# Patient Record
Sex: Male | Born: 1955
Health system: Southern US, Community
[De-identification: ages and names within clinical notes are randomized; demographics above are authoritative.]

## PROBLEM LIST (undated history)

## (undated) DIAGNOSIS — I252 Old myocardial infarction: Secondary | ICD-10-CM

## (undated) DIAGNOSIS — F32A Depression, unspecified: Secondary | ICD-10-CM

## (undated) DIAGNOSIS — I10 Essential (primary) hypertension: Secondary | ICD-10-CM

## (undated) DIAGNOSIS — C61 Malignant neoplasm of prostate: Secondary | ICD-10-CM

## (undated) DIAGNOSIS — R3915 Urgency of urination: Secondary | ICD-10-CM

## (undated) DIAGNOSIS — I2510 Atherosclerotic heart disease of native coronary artery without angina pectoris: Secondary | ICD-10-CM

## (undated) DIAGNOSIS — C3490 Malignant neoplasm of unspecified part of unspecified bronchus or lung: Secondary | ICD-10-CM

## (undated) DIAGNOSIS — R058 Other specified cough: Secondary | ICD-10-CM

## (undated) DIAGNOSIS — Z955 Presence of coronary angioplasty implant and graft: Secondary | ICD-10-CM

## (undated) DIAGNOSIS — E785 Hyperlipidemia, unspecified: Secondary | ICD-10-CM

## (undated) DIAGNOSIS — M1990 Unspecified osteoarthritis, unspecified site: Secondary | ICD-10-CM

## (undated) DIAGNOSIS — R350 Frequency of micturition: Secondary | ICD-10-CM

## (undated) DIAGNOSIS — M329 Systemic lupus erythematosus, unspecified: Secondary | ICD-10-CM

## (undated) DIAGNOSIS — J410 Simple chronic bronchitis: Secondary | ICD-10-CM

## (undated) DIAGNOSIS — F329 Major depressive disorder, single episode, unspecified: Secondary | ICD-10-CM

## (undated) DIAGNOSIS — Z974 Presence of external hearing-aid: Secondary | ICD-10-CM

## (undated) DIAGNOSIS — R05 Cough: Secondary | ICD-10-CM

## (undated) HISTORY — DX: Malignant neoplasm of unspecified part of unspecified bronchus or lung: C34.90

## (undated) HISTORY — PX: DOBUTAMINE STRESS ECHO: SHX5426

## (undated) HISTORY — PX: CORONARY ARTERY BYPASS GRAFT: SHX141

## (undated) HISTORY — PX: CARDIOVASCULAR STRESS TEST: SHX262

## (undated) HISTORY — PX: CORONARY ANGIOPLASTY WITH STENT PLACEMENT: SHX49

## (undated) HISTORY — PX: LUNG CANCER SURGERY: SHX702

## (undated) HISTORY — PX: TONSILLECTOMY: SUR1361

---

## 2004-04-21 ENCOUNTER — Other Ambulatory Visit: Payer: Self-pay

## 2004-04-21 ENCOUNTER — Inpatient Hospital Stay: Payer: Self-pay | Admitting: Internal Medicine

## 2004-04-24 ENCOUNTER — Other Ambulatory Visit: Payer: Self-pay

## 2005-05-04 ENCOUNTER — Ambulatory Visit: Payer: Self-pay | Admitting: Cardiology

## 2005-05-04 ENCOUNTER — Inpatient Hospital Stay (HOSPITAL_COMMUNITY): Admission: EM | Admit: 2005-05-04 | Discharge: 2005-05-07 | Payer: Self-pay | Admitting: Emergency Medicine

## 2009-12-05 DIAGNOSIS — I10 Essential (primary) hypertension: Secondary | ICD-10-CM | POA: Insufficient documentation

## 2009-12-05 DIAGNOSIS — F32A Depression, unspecified: Secondary | ICD-10-CM | POA: Insufficient documentation

## 2011-01-08 DIAGNOSIS — K648 Other hemorrhoids: Secondary | ICD-10-CM | POA: Insufficient documentation

## 2013-03-19 HISTORY — PX: WRIST GANGLION EXCISION: SUR520

## 2014-10-18 ENCOUNTER — Other Ambulatory Visit (HOSPITAL_COMMUNITY): Payer: Self-pay | Admitting: Family Medicine

## 2014-10-18 DIAGNOSIS — R319 Hematuria, unspecified: Secondary | ICD-10-CM

## 2014-10-20 ENCOUNTER — Other Ambulatory Visit (HOSPITAL_COMMUNITY): Payer: Self-pay

## 2016-04-11 ENCOUNTER — Other Ambulatory Visit (HOSPITAL_COMMUNITY)
Admission: AD | Admit: 2016-04-11 | Discharge: 2016-04-11 | Disposition: A | Discharge status: Home / Self Care | Payer: Commercial Managed Care - PPO | Source: Skilled Nursing Facility | Attending: Urology | Admitting: Urology

## 2016-04-11 ENCOUNTER — Ambulatory Visit (INDEPENDENT_AMBULATORY_CARE_PROVIDER_SITE_OTHER): Payer: Commercial Managed Care - PPO | Admitting: Urology

## 2016-04-11 ENCOUNTER — Other Ambulatory Visit: Payer: Self-pay | Admitting: Urology

## 2016-04-11 DIAGNOSIS — R3129 Other microscopic hematuria: Secondary | ICD-10-CM | POA: Diagnosis not present

## 2016-04-11 DIAGNOSIS — R972 Elevated prostate specific antigen [PSA]: Secondary | ICD-10-CM | POA: Diagnosis not present

## 2016-04-11 LAB — URINALYSIS, ROUTINE W REFLEX MICROSCOPIC
Bacteria, UA: NONE SEEN
Bilirubin Urine: NEGATIVE
GLUCOSE, UA: NEGATIVE mg/dL
Ketones, ur: NEGATIVE mg/dL
Leukocytes, UA: NEGATIVE
Nitrite: NEGATIVE
PROTEIN: NEGATIVE mg/dL
SPECIFIC GRAVITY, URINE: 1.025 (ref 1.005–1.030)
pH: 5 (ref 5.0–8.0)

## 2016-05-02 ENCOUNTER — Ambulatory Visit (HOSPITAL_COMMUNITY)
Admission: RE | Admit: 2016-05-02 | Discharge: 2016-05-02 | Disposition: A | Discharge status: Home / Self Care | Payer: Commercial Managed Care - PPO | Source: Ambulatory Visit | Attending: Urology | Admitting: Urology

## 2016-05-02 DIAGNOSIS — R972 Elevated prostate specific antigen [PSA]: Secondary | ICD-10-CM | POA: Diagnosis not present

## 2016-05-02 DIAGNOSIS — Z8042 Family history of malignant neoplasm of prostate: Secondary | ICD-10-CM | POA: Insufficient documentation

## 2016-05-02 DIAGNOSIS — C61 Malignant neoplasm of prostate: Secondary | ICD-10-CM | POA: Diagnosis not present

## 2016-05-02 HISTORY — PX: PROSTATE BIOPSY: SHX241

## 2016-05-02 MED ORDER — LIDOCAINE HCL (PF) 2 % IJ SOLN
10.0000 mL | Freq: Once | INTRAMUSCULAR | Status: AC
  Administered 2016-05-02: 10 mL

## 2016-05-02 MED ORDER — GENTAMICIN SULFATE 40 MG/ML IJ SOLN
INTRAMUSCULAR | Status: AC
  Administered 2016-05-02: 80 mg via INTRAMUSCULAR
  Filled 2016-05-02: qty 2

## 2016-05-02 MED ORDER — GENTAMICIN SULFATE 40 MG/ML IJ SOLN
80.0000 mg | Freq: Once | INTRAMUSCULAR | Status: AC
  Administered 2016-05-02: 80 mg via INTRAMUSCULAR

## 2016-05-02 MED ORDER — LIDOCAINE HCL (PF) 2 % IJ SOLN
INTRAMUSCULAR | Status: AC
  Administered 2016-05-02: 10 mL
  Filled 2016-05-02: qty 10

## 2016-05-02 NOTE — Discharge Instructions (Signed)
Transrectal Ultrasound-Guided Biopsy °A transrectal ultrasound-guided biopsy is a procedure to take samples of tissue from your prostate. Ultrasound images are used to guide the procedure. It is usually done to check the prostate gland for cancer. °What happens before the procedure? °· Do not eat or drink after midnight on the night before your procedure. °· Take medicines as your doctor tells you. °· Your doctor may have you stop taking some medicines 5-7 days before the procedure. °· You will be given an enema before your procedure. During an enema, a liquid is put into your butt (rectum) to clear out waste. °· You may have lab tests the day of your procedure. °· Make plans to have someone drive you home. °What happens during the procedure? °· You will be given medicine to help you relax before the procedure. An IV tube will be put into one of your veins. It will be used to give fluids and medicine. °· You will be given medicine to reduce the risk of infection (antibiotic). °· You will be placed on your side. °· A probe with gel will be put in your butt. This is used to take pictures of your prostate and the area around it. °· A medicine to numb the area is put into your prostate. °· A biopsy needle is then inserted and guided to your prostate. °· Samples of prostate tissue are taken. The needle is removed. °· The samples are sent to a lab to be checked. Results are usually back in 2-3 days. °What happens after the procedure? °· You will be taken to a room where you will be watched until you are doing okay. °· You may have some pain in the area around your butt. You will be given medicines for this. °· You may be able to go home the same day. Sometimes, an overnight stay in the hospital is needed. °This information is not intended to replace advice given to you by your health care provider. Make sure you discuss any questions you have with your health care provider. °Document Released: 02/21/2009 Document Revised:  08/11/2015 Document Reviewed: 10/22/2012 °Elsevier Interactive Patient Education © 2017 Elsevier Inc. ° °

## 2016-05-07 DIAGNOSIS — M1990 Unspecified osteoarthritis, unspecified site: Secondary | ICD-10-CM | POA: Diagnosis not present

## 2016-05-07 DIAGNOSIS — R221 Localized swelling, mass and lump, neck: Secondary | ICD-10-CM | POA: Diagnosis not present

## 2016-05-07 DIAGNOSIS — R0982 Postnasal drip: Secondary | ICD-10-CM | POA: Diagnosis not present

## 2016-05-16 ENCOUNTER — Ambulatory Visit (INDEPENDENT_AMBULATORY_CARE_PROVIDER_SITE_OTHER): Payer: Commercial Managed Care - PPO | Admitting: Urology

## 2016-05-16 DIAGNOSIS — C61 Malignant neoplasm of prostate: Secondary | ICD-10-CM

## 2016-05-17 ENCOUNTER — Encounter: Payer: Self-pay | Admitting: Radiation Oncology

## 2016-05-17 DIAGNOSIS — C61 Malignant neoplasm of prostate: Secondary | ICD-10-CM | POA: Diagnosis not present

## 2016-05-18 ENCOUNTER — Other Ambulatory Visit: Payer: Self-pay | Admitting: Urology

## 2016-05-18 DIAGNOSIS — C61 Malignant neoplasm of prostate: Secondary | ICD-10-CM

## 2016-05-23 ENCOUNTER — Encounter (HOSPITAL_COMMUNITY)
Admission: RE | Admit: 2016-05-23 | Discharge: 2016-05-23 | Disposition: A | Discharge status: Home / Self Care | Payer: Commercial Managed Care - PPO | Source: Ambulatory Visit | Attending: Urology | Admitting: Urology

## 2016-05-23 ENCOUNTER — Ambulatory Visit (HOSPITAL_COMMUNITY)
Admission: RE | Admit: 2016-05-23 | Discharge: 2016-05-23 | Disposition: A | Discharge status: Home / Self Care | Payer: Commercial Managed Care - PPO | Source: Ambulatory Visit | Attending: Urology | Admitting: Urology

## 2016-05-23 DIAGNOSIS — I700 Atherosclerosis of aorta: Secondary | ICD-10-CM | POA: Diagnosis not present

## 2016-05-23 DIAGNOSIS — C61 Malignant neoplasm of prostate: Secondary | ICD-10-CM

## 2016-05-23 DIAGNOSIS — K4020 Bilateral inguinal hernia, without obstruction or gangrene, not specified as recurrent: Secondary | ICD-10-CM | POA: Insufficient documentation

## 2016-05-23 DIAGNOSIS — N201 Calculus of ureter: Secondary | ICD-10-CM | POA: Diagnosis not present

## 2016-05-23 DIAGNOSIS — N400 Enlarged prostate without lower urinary tract symptoms: Secondary | ICD-10-CM | POA: Diagnosis not present

## 2016-05-23 MED ORDER — TECHNETIUM TC 99M MEDRONATE IV KIT
25.0000 | PACK | Freq: Once | INTRAVENOUS | Status: AC | PRN
  Administered 2016-05-23: 20.1 via INTRAVENOUS

## 2016-05-23 MED ORDER — IOPAMIDOL (ISOVUE-300) INJECTION 61%
100.0000 mL | Freq: Once | INTRAVENOUS | Status: AC | PRN
  Administered 2016-05-23: 100 mL via INTRAVENOUS

## 2016-05-28 ENCOUNTER — Encounter: Payer: Self-pay | Admitting: Radiation Oncology

## 2016-05-28 NOTE — Progress Notes (Signed)
GU Location of Tumor / Histology: prostatic adenocarcinoma  If Prostate Cancer, Gleason Score is (4 + 3) and PSA is (5.0). Prostate volume: 54 gram.  Luis Griffin was referred to Dr. Alyson Ingles in February 2018 for evaluation of an elevated PSA.   Biopsies of prostate (if applicable) revealed:    Past/Anticipated interventions by urology, if any: biopsy, referral to radiation oncology  Past/Anticipated interventions by medical oncology, if any: no  Weight changes, if any: no  Wt Readings from Last 3 Encounters:  05/31/16 211 lb 3.2 oz (95.8 kg)    Bowel/Bladder complaints, if any: ED, sensation of not emptying his bladder completely, frequency, intermittency, urgency, weak stream, hesitancy, nocturia x 1   Nausea/Vomiting, if any: Had some nausea last night while trying to have a bowel movement.  Pain issues, if any:  No  SAFETY ISSUES:  Prior radiation? No  Pacemaker/ICD? No  Possible current pregnancy? no  Is the patient on methotrexate? No  Current Complaints / other details:  61 year old male. NKDA. Single. Has one son and one daughter. CT of abd/pelvis negative. Bone scan negative. Father with history of prostate ca. BP (!) 158/99   Pulse 81   Temp 98.2 F (36.8 C) (Oral)   Resp 18   Ht '5\' 11"'$  (1.803 m)   Wt 211 lb 3.2 oz (95.8 kg)   SpO2 100%   BMI 29.46 kg/m

## 2016-05-30 DIAGNOSIS — C61 Malignant neoplasm of prostate: Secondary | ICD-10-CM | POA: Insufficient documentation

## 2016-05-30 NOTE — Progress Notes (Signed)
Radiation Oncology         [id number]) [phone] ________________________________  Initial outpatient Consultation  Name: Luis Griffin MRN: [id number]  Date: 05/31/2016  DOB: 26-May-1955  [id number] FAMILY PRACTINE  McKenzie, Candee Furbish, MD   REFERRING PHYSICIAN: Cleon Gustin, MD  DIAGNOSIS: 61 y.o. gentleman with Stage T1c adenocarcinoma of the prostate with Gleason Score of 4+3=7, and PSA of 5.0.    ICD-9-CM ICD-10-CM   1. Prostate cancer ([place]) 185 C61     HISTORY OF PRESENT ILLNESS: Luis Griffin is a 61 y.o. male with a diagnosis of prostate cancer. He was noted to have an elevated PSA of 5.0 by his primary care physician, Dr. Judd Lien.  Accordingly, he was referred for evaluation in urology by Dr. Alyson Ingles on 04/11/16,  digital rectal examination was performed at that time revealing no nodules or induration.  The patient proceeded to transrectal ultrasound with 12 biopsies of the prostate on 05/02/16.  The prostate volume measured 54 cc.  Out of 12 core biopsies,2 were positive.  The maximum Gleason score was 4+3=7, and this was seen in the right base.  He underwent a Bone scan and CT C/A/P on 05/23/16 both of which were negative for metastatic disease.  He has a family history of prostate cancer in his father, diagnosed at age 49.  The patient reviewed the biopsy results with his urologist and he has kindly been referred today for discussion of potential radiation treatment options.     PREVIOUS RADIATION THERAPY: No  PAST MEDICAL HISTORY:  Past Medical History:  Diagnosis Date  . Depression   . Heart attack   . Heart disease   . Hypercholesterolemia   . Hypertension   . Prostate cancer ([place])   . Systemic lupus erythematosus ([place])       PAST SURGICAL HISTORY: Past Surgical History:  Procedure Laterality Date  . aspirate ganglion cyst    . CORONARY ARTERY BYPASS GRAFT    . PROSTATE BIOPSY      FAMILY HISTORY:  Family History  Problem Relation Age of  Onset  . Cancer Father     prostate ca  . Kidney cancer Father     SOCIAL HISTORY:  He presents to the office today with his fiance, Lollie Marrow. They live together in [place], [place].  He is a long distance Administrator. Social History   Social History  . Marital status: Unknown    Spouse name: N/A  . Number of children: N/A  . Years of education: N/A   Occupational History  . Not on file.   Social History Main Topics  . Smoking status: Former Smoker    Packs/day: 1.00    Types: Cigarettes    Quit date: 03/20/1975  . Smokeless tobacco: Never Used  . Alcohol use No  . Drug use: No  . Sexual activity: Not on file   Other Topics Concern  . Not on file   Social History Narrative  . No narrative on file    ALLERGIES: Varenicline  MEDICATIONS:  Current Outpatient Prescriptions  Medication Sig Dispense Refill  . aspirin 325 MG tablet Take 325 mg by mouth daily.    Marland Kitchen atenolol (TENORMIN) 25 MG tablet Take by mouth daily.    Marland Kitchen atorvastatin (LIPITOR) 10 MG tablet Take 10 mg by mouth daily.    . clopidogrel (PLAVIX) 75 MG tablet Take 75 mg by mouth daily.    Marland Kitchen ezetimibe (ZETIA) 10 MG tablet Take 10 mg by mouth daily.    Marland Kitchen  lisinopril (PRINIVIL,ZESTRIL) 10 MG tablet Take 10 mg by mouth daily.     No current facility-administered medications for this encounter.     REVIEW OF SYSTEMS:  On review of systems, the patient reports that he is doing well overall. He denies any chest pain, shortness of breath, cough, fevers, chills, night sweats, unintended weight changes. Heh as a history of heart disease and is s/p CABG 8 years ago. He takes ASA and Plavix daily since the time of his CABG. He does not use NTG. He denies any bowel disturbances, and denies abdominal pain, or vomiting. He reports longstanding constipation with bowel movements q 2 days.  He did have some significant bloating and nausea last night while trying to have a bowel movement. He reports that his stools are not generally  particularly hard but he occasionally gets an urge to defecate and cannot. This has been ongoing for years and unchanged recently. He denies any new musculoskeletal or joint aches or pains. His IPSS was 3, indicating mild urinary symptoms including frequency, intermittency, urgency and nocturia x 1. He reports mild ED. A complete review of systems is obtained and is otherwise negative.    PHYSICAL EXAM:  Wt Readings from Last 3 Encounters:  05/31/16 211 lb 3.2 oz (95.8 kg)   Temp Readings from Last 3 Encounters:  05/31/16 98.2 F (36.8 C) (Oral)  05/02/16 97.9 F (36.6 C) (Oral)   BP Readings from Last 3 Encounters:  05/31/16 (!) 158/99  05/02/16 (!) 148/88   Pulse Readings from Last 3 Encounters:  05/31/16 81  05/02/16 76   Pain Assessment Pain Score: 0-No pain/10  In general this is a well appearing caucasian male in no acute distress. He is alert and oriented x4 and appropriate throughout the examination. HEENT reveals that the patient is normocephalic, atraumatic. EOMs are intact. PERRLA. Skin is intact without any evidence of gross lesions. Cardiovascular exam reveals a regular rate and rhythm, no clicks rubs or murmurs are auscultated. Chest is clear to auscultation bilaterally. Lymphatic assessment is performed and does not reveal any adenopathy in the cervical, supraclavicular, axillary, or inguinal chains. Abdomen has active bowel sounds in all quadrants and is intact. The abdomen is soft, non tender, non distended. Lower extremities are negative for pretibial pitting edema, deep calf tenderness, cyanosis or clubbing.   KPS = 100  100 - Normal; no complaints; no evidence of disease. 90   - Able to carry on normal activity; minor signs or symptoms of disease. 80   - Normal activity with effort; some signs or symptoms of disease. 52   - Cares for self; unable to carry on normal activity or to do active work. 60   - Requires occasional assistance, but is able to care for most  of his personal needs. 50   - Requires considerable assistance and frequent medical care. 55   - Disabled; requires special care and assistance. 25   - Severely disabled; hospital admission is indicated although death not imminent. 9   - Very sick; hospital admission necessary; active supportive treatment necessary. 10   - Moribund; fatal processes progressing rapidly. 0     - Dead  Karnofsky DA, Abelmann WH, Craver LS and Burchenal JH [phone]) The use of the nitrogen mustards in the palliative treatment of carcinoma: with particular reference to bronchogenic carcinoma Cancer 1 634-56  LABORATORY DATA:  No results found for: WBC, HGB, HCT, MCV, PLT No results found for: NA, K, CL, CO2 No results found  for: ALT, AST, GGT, ALKPHOS, BILITOT   RADIOGRAPHY: Ct Abdomen Pelvis W Wo Contrast  Result Date: 05/23/2016 CLINICAL DATA:  Enlarged prostate, recent diagnosis of prostate cancer with rising PSA. EXAM: CT ABDOMEN AND PELVIS WITHOUT AND WITH CONTRAST TECHNIQUE: Multidetector CT imaging of the abdomen and pelvis was performed following the standard protocol before and following the bolus administration of intravenous contrast. CONTRAST:  125m ISOVUE-300 IOPAMIDOL (ISOVUE-300) INJECTION 61% COMPARISON:  Bone scan 05/23/2016 and CT abdomen pelvis 10/21/2014. FINDINGS: Lower chest: Lung bases show no acute findings. Heart size normal. No pericardial or pleural effusion. Hepatobiliary: Liver and gallbladder are unremarkable. No biliary ductal dilatation. Pancreas: Negative. Spleen: Negative. Adrenals/Urinary Tract: Right adrenal gland is unremarkable. Slight thickening of the left adrenal gland, as before. Subcentimeter low-attenuation lesions in the right kidney are too small to definitively characterize but statistically, cysts are most likely. Scarring in the lower pole left kidney. No urinary stones. Ureters are decompressed although there is a 4 mm stone in the distal right ureter (series 3, image 67).  Bladder is low in volume. Stomach/Bowel: Stomach, small bowel, appendix and colon are unremarkable. Vascular/Lymphatic: Atherosclerotic calcification of the arterial vasculature without abdominal aortic aneurysm. No pathologically enlarged lymph nodes. Reproductive: Prostate is mildly enlarged. Other: Small bilateral inguinal hernias contain fat. No free fluid. Mesenteries and peritoneum are unremarkable. Musculoskeletal: No worrisome lytic or sclerotic lesions. IMPRESSION: 1. No evidence of metastatic disease. 2. 4 mm distal right ureteral stone without obstruction. 3.  Aortic atherosclerosis (ICD10-170.0). 4. Enlarged prostate. Electronically Signed   By: MLorin PicketM.D.   On: 05/23/2016 15:37   Nm Bone Scan Whole Body  Result Date: 05/23/2016 CLINICAL DATA:  New diagnosis of prostate carcinoma, some pain in the left ankle and right elbow for the past few months, no injury EXAM: NUCLEAR MEDICINE WHOLE BODY BONE SCAN TECHNIQUE: Whole body anterior and posterior images were obtained approximately 3 hours after intravenous injection of radiopharmaceutical. RADIOPHARMACEUTICALS:  20.1 mCi Technetium-939mDP IV COMPARISON:  CT abdomen pelvis of 10/21/2014 FINDINGS: Activity of the radionuclide throughout the skeleton appears completely normal. There is no evidence of bone metastasis. Mild increase activity is noted in the left shoulder near the [facility] most likely degenerative in origin. IMPRESSION: No definite bone metastasis. Probable degenerative change in the left AC joint. Electronically Signed   By: PaIvar Drape.D.   On: 05/23/2016 14:56      IMPRESSION/PLAN: 1. [age].o. gentleman with Stage T1c adenocarcinoma of the prostate with Gleason Score of 4+3=7, and PSA of 5.0.  We discussed the patient's workup and we outlined the nature of prostate cancer in this setting. The patient's T stage, Gleason's score, and PSA put him into the intermediate risk group. Accordingly he is eligible for a variety of  potential treatment options including external radiation, brachytherapy, or prostatectomy. We discussed and outlined the risks, benefits, short and long-term effects associated with radiotherapy.   At the end of the conversation the patient is interested in moving forward with prostate brachytherapy. We will share our discussion with Dr. McAlyson Inglesnd move forward with scheduling the procedure in the near future. We will also need to obtain preoperative cardiac clearance from the patient's cardiologist, Dr. JeAlmira Coaster     AsNicholos JohnsPA-C    MaTyler PitaMD  [facility] Direct Dial: [phone]: [phone]  Skype  LinkedIn  This document serves as a record of services personally performed by MaTyler PitaMD  and [employer], PA-C. It was created on their behalf by Arlyce Harman, a trained medical scribe. The creation of this record is based on the scribe's personal observations and the provider's statements to them. This document has been checked and approved by the attending provider.

## 2016-05-31 ENCOUNTER — Encounter: Payer: Self-pay | Admitting: Radiation Oncology

## 2016-05-31 ENCOUNTER — Ambulatory Visit
Admission: RE | Admit: 2016-05-31 | Discharge: 2016-05-31 | Disposition: A | Discharge status: Home / Self Care | Payer: Commercial Managed Care - PPO | Source: Ambulatory Visit | Attending: Radiation Oncology | Admitting: Radiation Oncology

## 2016-05-31 DIAGNOSIS — F329 Major depressive disorder, single episode, unspecified: Secondary | ICD-10-CM | POA: Diagnosis not present

## 2016-05-31 DIAGNOSIS — E7800 Pure hypercholesterolemia, unspecified: Secondary | ICD-10-CM | POA: Diagnosis not present

## 2016-05-31 DIAGNOSIS — I700 Atherosclerosis of aorta: Secondary | ICD-10-CM | POA: Diagnosis not present

## 2016-05-31 DIAGNOSIS — Z8051 Family history of malignant neoplasm of kidney: Secondary | ICD-10-CM | POA: Insufficient documentation

## 2016-05-31 DIAGNOSIS — Z951 Presence of aortocoronary bypass graft: Secondary | ICD-10-CM | POA: Insufficient documentation

## 2016-05-31 DIAGNOSIS — N201 Calculus of ureter: Secondary | ICD-10-CM | POA: Insufficient documentation

## 2016-05-31 DIAGNOSIS — C61 Malignant neoplasm of prostate: Secondary | ICD-10-CM | POA: Diagnosis not present

## 2016-05-31 DIAGNOSIS — K4020 Bilateral inguinal hernia, without obstruction or gangrene, not specified as recurrent: Secondary | ICD-10-CM | POA: Diagnosis not present

## 2016-05-31 DIAGNOSIS — M329 Systemic lupus erythematosus, unspecified: Secondary | ICD-10-CM | POA: Diagnosis not present

## 2016-05-31 DIAGNOSIS — Z87891 Personal history of nicotine dependence: Secondary | ICD-10-CM | POA: Insufficient documentation

## 2016-05-31 DIAGNOSIS — Z8042 Family history of malignant neoplasm of prostate: Secondary | ICD-10-CM | POA: Diagnosis not present

## 2016-05-31 DIAGNOSIS — I10 Essential (primary) hypertension: Secondary | ICD-10-CM | POA: Diagnosis not present

## 2016-05-31 DIAGNOSIS — I252 Old myocardial infarction: Secondary | ICD-10-CM | POA: Diagnosis not present

## 2016-05-31 DIAGNOSIS — Z7982 Long term (current) use of aspirin: Secondary | ICD-10-CM | POA: Insufficient documentation

## 2016-05-31 HISTORY — DX: Malignant neoplasm of prostate: C61

## 2016-05-31 HISTORY — DX: Essential (primary) hypertension: I10

## 2016-05-31 HISTORY — DX: Depression, unspecified: F32.A

## 2016-05-31 HISTORY — DX: Systemic lupus erythematosus, unspecified: M32.9

## 2016-05-31 HISTORY — DX: Major depressive disorder, single episode, unspecified: F32.9

## 2016-05-31 NOTE — Addendum Note (Signed)
Encounter addended by: Heywood Footman, RN on: 05/31/2016  4:02 PM<BR>    Actions taken: Charge Capture section accepted

## 2016-06-01 ENCOUNTER — Telehealth: Payer: Self-pay | Admitting: *Deleted

## 2016-06-01 NOTE — Telephone Encounter (Signed)
CALLED PATIENT TO INFORM OF PRE-SEED CT FOR 07-05-16 AND HIS IMPLANT ON 08-17-16, SPOKE WITH PATIENT AND HE IS AWARE OF THESE APPTS.

## 2016-06-04 ENCOUNTER — Other Ambulatory Visit: Payer: Self-pay | Admitting: Urology

## 2016-06-13 ENCOUNTER — Ambulatory Visit (INDEPENDENT_AMBULATORY_CARE_PROVIDER_SITE_OTHER): Payer: Commercial Managed Care - PPO | Admitting: Urology

## 2016-06-13 DIAGNOSIS — C61 Malignant neoplasm of prostate: Secondary | ICD-10-CM

## 2016-07-04 ENCOUNTER — Telehealth: Payer: Self-pay | Admitting: *Deleted

## 2016-07-04 NOTE — Telephone Encounter (Signed)
CALLED PATIENT TO REMIND OF PRE-SEED APPTS. FOR 07-05-16, PT. AWARE OF THESE APPTS.

## 2016-07-05 ENCOUNTER — Ambulatory Visit (HOSPITAL_BASED_OUTPATIENT_CLINIC_OR_DEPARTMENT_OTHER)
Admission: RE | Admit: 2016-07-05 | Discharge: 2016-07-05 | Disposition: A | Discharge status: Home / Self Care | Payer: Commercial Managed Care - PPO | Source: Ambulatory Visit | Attending: Urology | Admitting: Urology

## 2016-07-05 ENCOUNTER — Ambulatory Visit
Admission: RE | Admit: 2016-07-05 | Discharge: 2016-07-05 | Disposition: A | Discharge status: Home / Self Care | Payer: Commercial Managed Care - PPO | Source: Ambulatory Visit | Attending: Radiation Oncology | Admitting: Radiation Oncology

## 2016-07-05 ENCOUNTER — Encounter (HOSPITAL_BASED_OUTPATIENT_CLINIC_OR_DEPARTMENT_OTHER)
Admission: RE | Admit: 2016-07-05 | Discharge: 2016-07-05 | Disposition: A | Discharge status: Home / Self Care | Payer: Commercial Managed Care - PPO | Source: Ambulatory Visit | Attending: Urology | Admitting: Urology

## 2016-07-05 ENCOUNTER — Ambulatory Visit (HOSPITAL_COMMUNITY): Payer: Commercial Managed Care - PPO

## 2016-07-05 DIAGNOSIS — Z8042 Family history of malignant neoplasm of prostate: Secondary | ICD-10-CM | POA: Diagnosis not present

## 2016-07-05 DIAGNOSIS — Z01818 Encounter for other preprocedural examination: Secondary | ICD-10-CM | POA: Insufficient documentation

## 2016-07-05 DIAGNOSIS — C61 Malignant neoplasm of prostate: Secondary | ICD-10-CM | POA: Diagnosis not present

## 2016-07-05 DIAGNOSIS — Z951 Presence of aortocoronary bypass graft: Secondary | ICD-10-CM | POA: Insufficient documentation

## 2016-07-05 DIAGNOSIS — R918 Other nonspecific abnormal finding of lung field: Secondary | ICD-10-CM | POA: Diagnosis not present

## 2016-07-05 DIAGNOSIS — R9431 Abnormal electrocardiogram [ECG] [EKG]: Secondary | ICD-10-CM | POA: Insufficient documentation

## 2016-07-05 NOTE — Progress Notes (Signed)
  Radiation Oncology         [phone] ________________________________  Name: Luis Griffin MRN: [id number]  Date: 07/05/2016  DOB: 1955/07/12  SIMULATION AND TREATMENT PLANNING NOTE PUBIC ARCH STUDY  [id number] FAMILY PRACTINE  McKenzie, Candee Furbish, MD  DIAGNOSIS: 61 y.o. gentleman with Stage T1c adenocarcinoma of the prostate with Gleason Score of 4+3=7, and PSA of 5.0.     ICD-9-CM ICD-10-CM   1. Prostate cancer ([place]) [place]:  The patient presented today for evaluation for possible prostate seed implant. He was brought to the radiation planning suite and placed supine on the CT couch. A 3-dimensional image study set was obtained in upload to the planning computer. There, on each axial slice, I contoured the prostate gland. Then, using three-dimensional radiation planning tools I reconstructed the prostate in view of the structures from the transperineal needle pathway to assess for possible pubic arch interference. In doing so, I did not appreciate any pubic arch interference. Also, the patient's prostate volume was estimated based on the drawn structure. The volume was 45 cc.  Given the pubic arch appearance and prostate volume, patient remains a good candidate to proceed with prostate seed implant. Today, he freely provided informed written consent to proceed.    PLAN: The patient will undergo prostate seed implant.   ________________________________  Sheral Apley. Tammi Klippel, M.D.

## 2016-08-08 ENCOUNTER — Encounter (HOSPITAL_BASED_OUTPATIENT_CLINIC_OR_DEPARTMENT_OTHER): Payer: Self-pay | Admitting: *Deleted

## 2016-08-08 NOTE — Progress Notes (Addendum)
NPO AFTER MN.  ARRIVE AT 0830.  GETTING LAB WORK DONE Friday 08-10-2016 (CBC, CMET, PT/INR, PTT).  CURRENT CXR AND EKG IN CHART AND EPIC.  WILL TAKE ATENOLOL AM DOS W/ SIPS OF WATER AND DO FLEET ENEMA.  PER PT TAKING ASA 325MG  AND PLAVIX 75MG  BECAUSE HAS COUPLE OF CORONARY ARTERY BLOCKAGE AS TOLD BY CARDIOLOGIST (DR [place]LEVENGER, [place]).  TOLD PT TO CONTINUE TAKING MEDS , HE NEEDS CARDIAC CLEARANCE TO STOP.  TO CALL PT WHEN HERE FROM DR [facility] OFFICE IF OR WHEN THEY HAVE CLEARANCE.  CALLED AND LM FOR SELITA, OR SCHEDULER FOR DR MCKENZIE ,  TO CHECK IF SHE ALREADY HAS CLEARANCE AND IF SO TO FAX.  ADDENDUM:  SPOKE W/ CONI , OR SCHEDULER, VIA PHONE TODAY.  STATED RECEIVED CALL FROM PT CARDIOLOGIST, DR CLEVENGER, THAT PT CAN STOP PLAVIX BUT ASA 325MG .  CONI WILL FAX WRITTEN CLEARANCE AND SHE RECEIVES IT.

## 2016-08-09 ENCOUNTER — Telehealth: Payer: Self-pay | Admitting: *Deleted

## 2016-08-09 NOTE — Telephone Encounter (Signed)
CALLED PATIENT TO REMIND OF LABS FOR IMPLANT ON 08-17-16, SPOKE WITH PATIENT AND HE IS AWARE OF THIS APPT.

## 2016-08-10 DIAGNOSIS — I252 Old myocardial infarction: Secondary | ICD-10-CM | POA: Diagnosis not present

## 2016-08-10 DIAGNOSIS — Z951 Presence of aortocoronary bypass graft: Secondary | ICD-10-CM | POA: Diagnosis not present

## 2016-08-10 DIAGNOSIS — Z79899 Other long term (current) drug therapy: Secondary | ICD-10-CM | POA: Diagnosis not present

## 2016-08-10 DIAGNOSIS — C61 Malignant neoplasm of prostate: Secondary | ICD-10-CM | POA: Diagnosis present

## 2016-08-10 DIAGNOSIS — I10 Essential (primary) hypertension: Secondary | ICD-10-CM | POA: Diagnosis not present

## 2016-08-10 DIAGNOSIS — Z8042 Family history of malignant neoplasm of prostate: Secondary | ICD-10-CM | POA: Diagnosis not present

## 2016-08-10 DIAGNOSIS — F17210 Nicotine dependence, cigarettes, uncomplicated: Secondary | ICD-10-CM | POA: Diagnosis not present

## 2016-08-10 DIAGNOSIS — E785 Hyperlipidemia, unspecified: Secondary | ICD-10-CM | POA: Diagnosis not present

## 2016-08-10 DIAGNOSIS — F329 Major depressive disorder, single episode, unspecified: Secondary | ICD-10-CM | POA: Diagnosis not present

## 2016-08-10 DIAGNOSIS — Z7982 Long term (current) use of aspirin: Secondary | ICD-10-CM | POA: Diagnosis not present

## 2016-08-10 DIAGNOSIS — M329 Systemic lupus erythematosus, unspecified: Secondary | ICD-10-CM | POA: Diagnosis not present

## 2016-08-10 DIAGNOSIS — Z955 Presence of coronary angioplasty implant and graft: Secondary | ICD-10-CM | POA: Diagnosis not present

## 2016-08-10 DIAGNOSIS — I2510 Atherosclerotic heart disease of native coronary artery without angina pectoris: Secondary | ICD-10-CM | POA: Diagnosis not present

## 2016-08-10 LAB — CBC
HCT: 51.2 % (ref 39.0–52.0)
HEMOGLOBIN: 18.1 g/dL — AB (ref 13.0–17.0)
MCH: 31.2 pg (ref 26.0–34.0)
MCHC: 35.4 g/dL (ref 30.0–36.0)
MCV: 88.3 fL (ref 78.0–100.0)
Platelets: 219 10*3/uL (ref 150–400)
RBC: 5.8 MIL/uL (ref 4.22–5.81)
RDW: 13.4 % (ref 11.5–15.5)
WBC: 9.6 10*3/uL (ref 4.0–10.5)

## 2016-08-10 LAB — COMPREHENSIVE METABOLIC PANEL
ALT: 26 U/L (ref 17–63)
ANION GAP: 8 (ref 5–15)
AST: 20 U/L (ref 15–41)
Albumin: 4.3 g/dL (ref 3.5–5.0)
Alkaline Phosphatase: 84 U/L (ref 38–126)
BILIRUBIN TOTAL: 0.7 mg/dL (ref 0.3–1.2)
BUN: 17 mg/dL (ref 6–20)
CALCIUM: 9.1 mg/dL (ref 8.9–10.3)
CO2: 22 mmol/L (ref 22–32)
Chloride: 110 mmol/L (ref 101–111)
Creatinine, Ser: 0.87 mg/dL (ref 0.61–1.24)
GFR calc Af Amer: >60 mL/min (ref >60)
GFR calc non Af Amer: >60 mL/min (ref >60)
Glucose, Bld: 96 mg/dL (ref 65–99)
Potassium: 4.2 mmol/L (ref 3.5–5.1)
Sodium: 140 mmol/L (ref 135–145)
TOTAL PROTEIN: 7.1 g/dL (ref 6.5–8.1)

## 2016-08-10 LAB — APTT: aPTT: 27 seconds (ref 24–36)

## 2016-08-10 LAB — PROTIME-INR
INR: 0.96
Prothrombin Time: 12.8 seconds (ref 11.4–15.2)

## 2016-08-16 ENCOUNTER — Telehealth: Payer: Self-pay | Admitting: *Deleted

## 2016-08-16 NOTE — Progress Notes (Signed)
  Radiation Oncology         [phone] ________________________________  Name: DOMONIC KIMBALL MRN: [id number]  Date: 08/16/2016  DOB: [birth year]       Prostate Seed Implant  [id number], Virgina Evener, MD  No ref. provider found  DIAGNOSIS: 61 year old gentlemen with stage T1c adenocarcinoma of the prostate with Gleason Score of 4+3=7, and PSA of 5.0      ICD-9-CM ICD-10-CM   1. Pre-op testing V72.84 Z01.818 DG Chest 2 View     DG Chest 2 View    PROCEDURE: Insertion of radioactive I-125 seeds into the prostate gland.  RADIATION DOSE: 145 Gy, definitive therapy.  TECHNIQUE: JARI DIPASQUALE was brought to the operating room with the urologist. He was placed in the dorsolithotomy position. He was catheterized and a rectal tube was inserted. The perineum was shaved, prepped and draped. The ultrasound probe was then introduced into the rectum to see the prostate gland.  TREATMENT DEVICE: A needle grid was attached to the ultrasound probe stand and anchor needles were placed.  3D PLANNING: The prostate was imaged in 3D using a sagittal sweep of the prostate probe. These images were transferred to the planning computer. There, the prostate, urethra and rectum were defined on each axial reconstructed image. Then, the software created an optimized 3D plan and a few seed positions were adjusted. The quality of the plan was reviewed using [facility] information for the target and the following two organs at risk:  Urethra and Rectum.  Then the accepted plan was uploaded to the seed Selectron afterloading unit.  PROSTATE VOLUME STUDY:  Using transrectal ultrasound the volume of the prostate was verified to be 51 cc.  SPECIAL TREATMENT PROCEDURE/SUPERVISION AND HANDLING: The Nucletron FIRST system was used to place the needles under sagittal guidance. A total of 22 needles were used to deposit 77 seeds in the prostate gland. The individual seed activity was 0.498 mCi.  SpaceOAR:  Not Used  COMPLEX  SIMULATION: At the end of the procedure, an anterior radiograph of the pelvis was obtained to document seed positioning and count. Cystoscopy was performed to check the urethra and bladder.  MICRODOSIMETRY: At the end of the procedure, the patient was emitting 0.08 mR/hr at 1 meter. Accordingly, he was considered safe for hospital discharge.  PLAN: The patient will return to the radiation oncology clinic for post implant CT dosimetry in three weeks.   ________________________________  Sheral Apley Tammi Klippel, M.D.

## 2016-08-16 NOTE — Telephone Encounter (Signed)
Called patient to remind of procedure for 08-17-16, spoke with patient and he is aware of this procedure.

## 2016-08-17 ENCOUNTER — Encounter (HOSPITAL_BASED_OUTPATIENT_CLINIC_OR_DEPARTMENT_OTHER)
Admission: RE | Disposition: A | Discharge status: Home / Self Care | Payer: Self-pay | Source: Ambulatory Visit | Attending: Urology

## 2016-08-17 ENCOUNTER — Ambulatory Visit (HOSPITAL_COMMUNITY): Payer: Commercial Managed Care - PPO

## 2016-08-17 ENCOUNTER — Ambulatory Visit (HOSPITAL_BASED_OUTPATIENT_CLINIC_OR_DEPARTMENT_OTHER): Payer: Commercial Managed Care - PPO | Admitting: Anesthesiology

## 2016-08-17 ENCOUNTER — Ambulatory Visit (HOSPITAL_BASED_OUTPATIENT_CLINIC_OR_DEPARTMENT_OTHER)
Admission: RE | Admit: 2016-08-17 | Discharge: 2016-08-17 | Disposition: A | Discharge status: Home / Self Care | Payer: Commercial Managed Care - PPO | Source: Ambulatory Visit | Attending: Urology | Admitting: Urology

## 2016-08-17 ENCOUNTER — Encounter (HOSPITAL_BASED_OUTPATIENT_CLINIC_OR_DEPARTMENT_OTHER): Payer: Self-pay

## 2016-08-17 DIAGNOSIS — F329 Major depressive disorder, single episode, unspecified: Secondary | ICD-10-CM | POA: Insufficient documentation

## 2016-08-17 DIAGNOSIS — C61 Malignant neoplasm of prostate: Secondary | ICD-10-CM | POA: Insufficient documentation

## 2016-08-17 DIAGNOSIS — I10 Essential (primary) hypertension: Secondary | ICD-10-CM | POA: Diagnosis not present

## 2016-08-17 DIAGNOSIS — Z8042 Family history of malignant neoplasm of prostate: Secondary | ICD-10-CM | POA: Diagnosis not present

## 2016-08-17 DIAGNOSIS — Z951 Presence of aortocoronary bypass graft: Secondary | ICD-10-CM | POA: Insufficient documentation

## 2016-08-17 DIAGNOSIS — E785 Hyperlipidemia, unspecified: Secondary | ICD-10-CM | POA: Insufficient documentation

## 2016-08-17 DIAGNOSIS — Z955 Presence of coronary angioplasty implant and graft: Secondary | ICD-10-CM | POA: Insufficient documentation

## 2016-08-17 DIAGNOSIS — Z01818 Encounter for other preprocedural examination: Secondary | ICD-10-CM

## 2016-08-17 DIAGNOSIS — I252 Old myocardial infarction: Secondary | ICD-10-CM | POA: Insufficient documentation

## 2016-08-17 DIAGNOSIS — Z7982 Long term (current) use of aspirin: Secondary | ICD-10-CM | POA: Insufficient documentation

## 2016-08-17 DIAGNOSIS — I2510 Atherosclerotic heart disease of native coronary artery without angina pectoris: Secondary | ICD-10-CM | POA: Insufficient documentation

## 2016-08-17 DIAGNOSIS — Z79899 Other long term (current) drug therapy: Secondary | ICD-10-CM | POA: Insufficient documentation

## 2016-08-17 DIAGNOSIS — F17210 Nicotine dependence, cigarettes, uncomplicated: Secondary | ICD-10-CM | POA: Insufficient documentation

## 2016-08-17 DIAGNOSIS — M329 Systemic lupus erythematosus, unspecified: Secondary | ICD-10-CM | POA: Insufficient documentation

## 2016-08-17 HISTORY — DX: Hyperlipidemia, unspecified: E78.5

## 2016-08-17 HISTORY — DX: Presence of external hearing-aid: Z97.4

## 2016-08-17 HISTORY — DX: Cough: R05

## 2016-08-17 HISTORY — DX: Unspecified osteoarthritis, unspecified site: M19.90

## 2016-08-17 HISTORY — DX: Presence of coronary angioplasty implant and graft: Z95.5

## 2016-08-17 HISTORY — DX: Frequency of micturition: R35.0

## 2016-08-17 HISTORY — DX: Urgency of urination: R39.15

## 2016-08-17 HISTORY — DX: Simple chronic bronchitis: J41.0

## 2016-08-17 HISTORY — DX: Atherosclerotic heart disease of native coronary artery without angina pectoris: I25.10

## 2016-08-17 HISTORY — PX: RADIOACTIVE SEED IMPLANT: SHX5150

## 2016-08-17 HISTORY — DX: Other specified cough: R05.8

## 2016-08-17 HISTORY — DX: Old myocardial infarction: I25.2

## 2016-08-17 SURGERY — INSERTION, RADIATION SOURCE, PROSTATE
Anesthesia: General | Site: Prostate

## 2016-08-17 MED ORDER — MIDAZOLAM HCL 2 MG/2ML IJ SOLN
INTRAMUSCULAR | Status: AC
  Filled 2016-08-17: qty 2

## 2016-08-17 MED ORDER — LIDOCAINE 2% (20 MG/ML) 5 ML SYRINGE
INTRAMUSCULAR | Status: DC | PRN
  Administered 2016-08-17: 100 mg via INTRAVENOUS

## 2016-08-17 MED ORDER — MEPERIDINE HCL 25 MG/ML IJ SOLN
6.2500 mg | INTRAMUSCULAR | Status: DC | PRN
  Filled 2016-08-17: qty 1

## 2016-08-17 MED ORDER — FLEET ENEMA 7-19 GM/118ML RE ENEM
1.0000 | ENEMA | Freq: Once | RECTAL | Status: DC
  Filled 2016-08-17: qty 1

## 2016-08-17 MED ORDER — FENTANYL CITRATE (PF) 100 MCG/2ML IJ SOLN
INTRAMUSCULAR | Status: DC | PRN
  Administered 2016-08-17: 50 ug via INTRAVENOUS

## 2016-08-17 MED ORDER — ONDANSETRON HCL 4 MG/2ML IJ SOLN
INTRAMUSCULAR | Status: DC | PRN
  Administered 2016-08-17: 4 mg via INTRAVENOUS

## 2016-08-17 MED ORDER — IOHEXOL 300 MG/ML  SOLN
INTRAMUSCULAR | Status: DC | PRN
  Administered 2016-08-17: 7 mL

## 2016-08-17 MED ORDER — DEXAMETHASONE SODIUM PHOSPHATE 10 MG/ML IJ SOLN
INTRAMUSCULAR | Status: AC
  Filled 2016-08-17: qty 1

## 2016-08-17 MED ORDER — DEXAMETHASONE SODIUM PHOSPHATE 10 MG/ML IJ SOLN
INTRAMUSCULAR | Status: DC | PRN
  Administered 2016-08-17: 10 mg via INTRAVENOUS

## 2016-08-17 MED ORDER — PROPOFOL 10 MG/ML IV BOLUS
INTRAVENOUS | Status: AC
  Filled 2016-08-17: qty 40

## 2016-08-17 MED ORDER — PHENYLEPHRINE HCL 10 MG/ML IJ SOLN
INTRAMUSCULAR | Status: AC
  Filled 2016-08-17: qty 1

## 2016-08-17 MED ORDER — MIDAZOLAM HCL 5 MG/5ML IJ SOLN
INTRAMUSCULAR | Status: DC | PRN
  Administered 2016-08-17: 2 mg via INTRAVENOUS

## 2016-08-17 MED ORDER — ONDANSETRON HCL 4 MG/2ML IJ SOLN
INTRAMUSCULAR | Status: AC
  Filled 2016-08-17: qty 2

## 2016-08-17 MED ORDER — LACTATED RINGERS IV SOLN
INTRAVENOUS | Status: DC
  Filled 2016-08-17: qty 1000

## 2016-08-17 MED ORDER — METOCLOPRAMIDE HCL 5 MG/ML IJ SOLN
10.0000 mg | Freq: Once | INTRAMUSCULAR | Status: DC | PRN
  Filled 2016-08-17: qty 2

## 2016-08-17 MED ORDER — PROPOFOL 10 MG/ML IV BOLUS
INTRAVENOUS | Status: DC | PRN
  Administered 2016-08-17: 300 mg via INTRAVENOUS

## 2016-08-17 MED ORDER — FENTANYL CITRATE (PF) 100 MCG/2ML IJ SOLN
25.0000 ug | INTRAMUSCULAR | Status: DC | PRN
  Filled 2016-08-17: qty 1

## 2016-08-17 MED ORDER — EPHEDRINE 5 MG/ML INJ
INTRAVENOUS | Status: AC
  Filled 2016-08-17: qty 10

## 2016-08-17 MED ORDER — STERILE WATER FOR IRRIGATION IR SOLN
Status: DC | PRN
  Administered 2016-08-17: 3000 mL

## 2016-08-17 MED ORDER — GENTAMICIN SULFATE 40 MG/ML IJ SOLN
5.0000 mg/kg | Freq: Once | INTRAVENOUS | Status: AC
  Administered 2016-08-17: 480 mg via INTRAVENOUS
  Filled 2016-08-17 (×2): qty 12

## 2016-08-17 MED ORDER — HYDROCODONE-ACETAMINOPHEN 5-325 MG PO TABS: 1.0000 | ORAL_TABLET | Freq: Four times a day (QID) | ORAL | 0 refills | Status: DC | PRN

## 2016-08-17 MED ORDER — EPHEDRINE SULFATE-NACL 50-0.9 MG/10ML-% IV SOSY
PREFILLED_SYRINGE | INTRAVENOUS | Status: DC | PRN
  Administered 2016-08-17 (×3): 10 mg via INTRAVENOUS

## 2016-08-17 MED ORDER — PHENYLEPHRINE HCL 10 MG/ML IJ SOLN
INTRAVENOUS | Status: DC | PRN
  Administered 2016-08-17: 50 ug/min via INTRAVENOUS

## 2016-08-17 MED ORDER — LACTATED RINGERS IV SOLN
INTRAVENOUS | Status: DC
  Administered 2016-08-17: 10:00:00 via INTRAVENOUS
  Filled 2016-08-17: qty 1000

## 2016-08-17 MED ORDER — LIDOCAINE 2% (20 MG/ML) 5 ML SYRINGE
INTRAMUSCULAR | Status: AC
  Filled 2016-08-17: qty 5

## 2016-08-17 MED ORDER — FENTANYL CITRATE (PF) 100 MCG/2ML IJ SOLN
INTRAMUSCULAR | Status: AC
  Filled 2016-08-17: qty 2

## 2016-08-17 SURGICAL SUPPLY — 30 items
BAG URINE DRAINAGE (UROLOGICAL SUPPLIES) ×2 IMPLANT
BLADE CLIPPER SURG (BLADE) ×2 IMPLANT
CATH FOLEY 2WAY SLVR  5CC 16FR (CATHETERS) ×1
CATH FOLEY 2WAY SLVR 5CC 16FR (CATHETERS) ×1 IMPLANT
CATH ROBINSON RED A/P 20FR (CATHETERS) ×2 IMPLANT
CLOTH BEACON ORANGE TIMEOUT ST (SAFETY) ×2 IMPLANT
COVER BACK TABLE 60X90IN (DRAPES) ×2 IMPLANT
COVER MAYO STAND STRL (DRAPES) ×2 IMPLANT
DRSG TEGADERM 4X4.75 (GAUZE/BANDAGES/DRESSINGS) ×2 IMPLANT
DRSG TEGADERM 8X12 (GAUZE/BANDAGES/DRESSINGS) ×2 IMPLANT
GAUZE SPONGE 4X4 12PLY STRL LF (GAUZE/BANDAGES/DRESSINGS) ×2 IMPLANT
GLOVE BIO SURGEON STRL SZ7.5 (GLOVE) IMPLANT
GLOVE BIO SURGEON STRL SZ8 (GLOVE) ×4 IMPLANT
GLOVE ECLIPSE 8.0 STRL XLNG CF (GLOVE) IMPLANT
GOWN STRL REUS W/ TWL LRG LVL3 (GOWN DISPOSABLE) ×1 IMPLANT
GOWN STRL REUS W/ TWL XL LVL3 (GOWN DISPOSABLE) ×1 IMPLANT
GOWN STRL REUS W/TWL LRG LVL3 (GOWN DISPOSABLE) ×1
GOWN STRL REUS W/TWL XL LVL3 (GOWN DISPOSABLE) ×1
HOLDER FOLEY CATH W/STRAP (MISCELLANEOUS) ×2 IMPLANT
IV NS 1000ML (IV SOLUTION) ×1
IV NS 1000ML BAXH (IV SOLUTION) ×1 IMPLANT
KIT RM TURNOVER CYSTO AR (KITS) ×2 IMPLANT
MANIFOLD NEPTUNE II (INSTRUMENTS) IMPLANT
PACK CYSTO (CUSTOM PROCEDURE TRAY) ×2 IMPLANT
SYRINGE 10CC LL (SYRINGE) ×2 IMPLANT
TUBE CONNECTING 12X1/4 (SUCTIONS) IMPLANT
UNDERPAD 30X30 INCONTINENT (UNDERPADS AND DIAPERS) ×4 IMPLANT
WATER STERILE IRR 3000ML UROMA (IV SOLUTION) ×2 IMPLANT
WATER STERILE IRR 500ML POUR (IV SOLUTION) ×2 IMPLANT
selectSeed1-125 ×154 IMPLANT

## 2016-08-17 NOTE — Transfer of Care (Signed)
Immediate Anesthesia Transfer of Care Note  Patient: Luis Griffin  Procedure(s) Performed: Procedure(s): RADIOACTIVE SEED IMPLANT/BRACHYTHERAPY IMPLANT (N/A)  Patient Location: PACU  Anesthesia Type:General  Level of Consciousness: awake, alert , oriented and patient cooperative  Airway & Oxygen Therapy: Patient Spontanous Breathing and Patient connected to face mask oxygen  Post-op Assessment: Report given to RN and Post -op Vital signs reviewed and stable  Post vital signs: Reviewed and stable  Last Vitals:  Vitals:   08/17/16 0800  BP: (!) 143/90  Pulse: 60  Resp: 19  Temp: 36.6 C    Last Pain:  Vitals:   08/17/16 0800  TempSrc: Oral      Patients Stated Pain Goal: 6 (73/56/70 1410)  Complications: No apparent anesthesia complications

## 2016-08-17 NOTE — Anesthesia Preprocedure Evaluation (Signed)
Anesthesia Evaluation  Patient identified by MRN, date of birth, ID band Patient awake    Reviewed: Allergy & Precautions, NPO status , Patient's Chart, lab work & pertinent test results  Airway Mallampati: II  TM Distance: >3 FB Neck ROM: Full    Dental no notable dental hx.    Pulmonary Current Smoker,    Pulmonary exam normal breath sounds clear to auscultation       Cardiovascular Exercise Tolerance: Good hypertension, Pt. on medications + CAD, + Cardiac Stents and + CABG  Normal cardiovascular exam Rhythm:Regular Rate:Normal     Neuro/Psych negative neurological ROS  negative psych ROS   GI/Hepatic negative GI ROS, Neg liver ROS,   Endo/Other  negative endocrine ROS  Renal/GU negative Renal ROS  negative genitourinary   Musculoskeletal SLE   Abdominal   Peds negative pediatric ROS (+)  Hematology negative hematology ROS (+)   Anesthesia Other Findings   Reproductive/Obstetrics negative OB ROS                             Anesthesia Physical Anesthesia Plan  ASA: III  Anesthesia Plan: General   Post-op Pain Management:    Induction: Intravenous  Airway Management Planned: LMA  Additional Equipment:   Intra-op Plan:   Post-operative Plan: Extubation in OR  Informed Consent: I have reviewed the patients History and Physical, chart, labs and discussed the procedure including the risks, benefits and alternatives for the proposed anesthesia with the patient or authorized representative who has indicated his/her understanding and acceptance.   Dental advisory given  Plan Discussed with: CRNA  Anesthesia Plan Comments:         Anesthesia Quick Evaluation

## 2016-08-17 NOTE — Anesthesia Postprocedure Evaluation (Signed)
Anesthesia Post Note  Patient: QUANTAVIUS HUMM  Procedure(s) Performed: Procedure(s) (LRB): RADIOACTIVE SEED IMPLANT/BRACHYTHERAPY IMPLANT (N/A)     Patient location during evaluation: PACU Anesthesia Type: General Level of consciousness: awake and alert Pain management: pain level controlled Vital Signs Assessment: post-procedure vital signs reviewed and stable Respiratory status: spontaneous breathing, nonlabored ventilation, respiratory function stable and patient connected to nasal cannula oxygen Cardiovascular status: blood pressure returned to baseline and stable Postop Assessment: no signs of nausea or vomiting Anesthetic complications: no    Last Vitals:  Vitals:   08/17/16 1315 08/17/16 1406  BP: (!) 107/54 119/76  Pulse: 82 83  Resp: 20 (!) 6  Temp:  36.7 C    Last Pain:  Vitals:   08/17/16 0800  TempSrc: Oral                 Montez Hageman

## 2016-08-17 NOTE — H&P (Signed)
Urology Admission H&P  Chief Complaint: T1c prostate cancer  History of Present Illness: Luis Griffin is a 61 with intermediate risk prostate cancer here for brachytherapy  Past Medical History:  Diagnosis Date  . Coronary artery disease    cardiologsit-  dr Dellis Filbert clevenger  . Depression   . Frequency of urination   . History of MI (myocardial infarction)   . Hyperlipidemia   . Hypertension   . OA (osteoarthritis)   . Productive cough   . Prostate cancer ([place]) UROLOGIST-  DR Evonne Rinks/  ONCOLOGIST-  DR MANNING   dx 02/ 2018,  Stage T1c,  PSA 5.0,  Gleason 4+3, vol 45cc  . S/P drug eluting coronary stent placement   . Smokers' cough ([place])   . Systemic lupus erythematosus ([place])   . Urgency of urination   . Wears hearing aid in both ears    Past Surgical History:  Procedure Laterality Date  . CARDIOVASCULAR STRESS TEST  06-03-2007   dr clevenger ([facility])   moderate sized fixed defect in the inferolateral wall probable scar (poss. attenuation artifact),  small area peri-infarct ischemia in the inferolateral region,  segment of inferolateral is akinetic , septal motion consistant w/ post-op changes,  ef 65%,  no evidence significant ischemia  . CORONARY ANGIOPLASTY WITH STENT PLACEMENT  2007  at [facility]   x2 DES per pt  . CORONARY ARTERY BYPASS GRAFT  2006   [place]   x4 per pt  . DOBUTAMINE STRESS ECHO  09/14/2015   dr j. cleveger ([facility])   normal stress echo-- moderate LVH, ef 55-60%, mild inferior wall hypokinesis,  mild AV sclerosis without stenosis,    . PROSTATE BIOPSY  05/02/2016  . TONSILLECTOMY  child  . WRIST GANGLION EXCISION Right 2015    Home Medications:  Prescriptions Prior to Admission  Medication Sig Dispense Refill Last Dose  . aspirin 325 MG tablet Take 325 mg by mouth daily.   08/16/2016 at Unknown time  . atenolol (TENORMIN) 25 MG tablet Take 25 mg by mouth 2 (two) times daily.    08/17/2016 at 0700  . atorvastatin (LIPITOR) 10 MG  tablet Take 10 mg by mouth every evening.    08/16/2016 at Unknown time  . clopidogrel (PLAVIX) 75 MG tablet Take 75 mg by mouth every evening. PT TOOK LAST DOSE PRIOR TO SURGERY 08-17-2016 ON 08-08-2016   08/08/2016  . ezetimibe (ZETIA) 10 MG tablet Take 10 mg by mouth every evening.    08/16/2016 at Unknown time  . lisinopril (PRINIVIL,ZESTRIL) 10 MG tablet Take 10 mg by mouth every evening.    08/16/2016 at Unknown time   Allergies: No Active Allergies  Family History  Problem Relation Age of Onset  . Cancer Father        prostate ca  . Kidney cancer Father    Social History:  reports that he has been smoking Cigarettes.  He has a 45.00 pack-year smoking history. His smokeless tobacco use includes Snuff and Chew. He reports that he drinks alcohol. He reports that he does not use drugs.  Review of Systems  All other systems reviewed and are negative.   Physical Exam:  Vital signs in last 24 hours: Temp:  [97.8 F (36.6 C)] 97.8 F (36.6 C) (06/01 0800) Pulse Rate:  [60] 60 (06/01 0800) Resp:  [19] 19 (06/01 0800) BP: (143)/(90) 143/90 (06/01 0800) SpO2:  [99 %] 99 % (06/01 0800) Weight:  [94.8 kg (209 lb)] 94.8 kg (209  lb) (06/01 0800) Physical Exam  Constitutional: He is oriented to person, place, and time. He appears well-developed and well-nourished.  HENT:  Head: Normocephalic and atraumatic.  Eyes: EOM are normal. Pupils are equal, round, and reactive to light.  Neck: Normal range of motion. No thyromegaly present.  Cardiovascular: Normal rate and regular rhythm.   Respiratory: Effort normal. No respiratory distress.  GI: Soft. He exhibits no distension.  Musculoskeletal: Normal range of motion. He exhibits no edema.  Neurological: He is alert and oriented to person, place, and time.  Skin: Skin is warm and dry.  Psychiatric: He has a normal mood and affect. His behavior is normal. Judgment and thought content normal.    Laboratory Data:  No results found for this or  any previous visit (from the past 24 hour(s)). No results found for this or any previous visit (from the past 240 hour(s)). Creatinine:  Recent Labs  08/10/16 1334  CREATININE 0.87   Baseline Creatinine: 0.87  Impression/Assessment:  61yo with T1c prostate cancer  Plan:  The risks/benefits/alternatives to brachytherapy was explained to the patient and he understands and wishes to proceed with surgery  Luis Griffin 08/17/2016, 9:58 AM

## 2016-08-17 NOTE — Op Note (Signed)
PRE-OPERATIVE DIAGNOSIS:  Adenocarcinoma of the prostate  POST-OPERATIVE DIAGNOSIS:  Same  PROCEDURE:  Procedure(s): 1. I-125 radioactive seed implantation 2. Cystoscopy  SURGEON:  Surgeon(s): Nicolette Bang, MD  Radiation oncologist: Dr. Tyler Pita  ANESTHESIA:  General  EBL:  Minimal  DRAINS: 48 French Foley catheter  INDICATION: Luis Griffin is a 61 year old with a history of T1c prostate cancer. After discussing treatment options he has elected to proceed with brachytherapy  Description of procedure: After informed consent the patient was brought to the major OR, placed on the table and administered general anesthesia. He was then moved to the modified lithotomy position with his perineum perpendicular to the floor. His perineum and genitalia were then sterilely prepped. An official timeout was then performed. A 16 French Foley catheter was then placed in the bladder and filled with dilute contrast, a rectal tube was placed in the rectum and the transrectal ultrasound probe was placed in the rectum and affixed to the stand. He was then sterilely draped.  Real time ultrasonography was used along with the seed planning software [employer] Pro[employer] vs. 4.2.21. This was used to develop the seed plan including the number of needles as well as number of seeds required for complete and adequate coverage. Real-time ultrasonography was then used along with the previously developed plan and the Nucletron device to implant a total of 77 seeds using 22 needles. This proceeded without difficulty or complication.  A Foley catheter was then removed as well as the transrectal ultrasound probe and rectal probe. Flexible cystoscopy was then performed using the 17 French flexible scope which revealed a normal urethra throughout its length down to the sphincter which appeared intact. The prostatic urethra revealed bilobar hypertrophy but no evidence of obstruction, seeds, spacers or lesions. The  bladder was then entered and fully and systematically inspected. The ureteral orifices were noted to be of normal configuration and position. The mucosa revealed no evidence of tumors. There were also no stones identified within the bladder. I noted no seeds or spacers on the floor of the bladder and retroflexion of the scope revealed no seeds protruding from the base of the prostate.  The cystoscope was then removed and a new 12 French Foley catheter was then inserted and the balloon was filled with 10 cc of sterile water. This was connected to closed system drainage and the patient was awakened and taken to recovery room in stable and satisfactory condition. He tolerated procedure well and there were no intraoperative complications.

## 2016-08-17 NOTE — Discharge Instructions (Signed)
Indwelling Urinary Catheter Care, Adult °Take good care of your catheter to keep it working and to prevent problems. °How to wear your catheter °Attach your catheter to your leg with tape (adhesive tape) or a leg strap. Make sure it is not too tight. If you use tape, remove any bits of tape that are already on the catheter. °How to wear a drainage bag °You should have: °· A large overnight bag. °· A small leg bag. ° °Overnight Bag °You may wear the overnight bag at any time. Always keep the bag below the level of your bladder but off the floor. When you sleep, put a clean plastic bag in a wastebasket. Then hang the bag inside the wastebasket. °Leg Bag °Never wear the leg bag at night. Always wear the leg bag below your knee. Keep the leg bag secure with a leg strap or tape. °How to care for your skin °· Clean the skin around the catheter at least once every day. °· Shower every day. Do not take baths. °· Put creams, lotions, or ointments on your genital area only as told by your doctor. °· Do not use powders, sprays, or lotions on your genital area. °How to clean your catheter and your skin °1. Wash your hands with soap and water. °2. Wet a washcloth in warm water and gentle (mild) soap. °3. Use the washcloth to clean the skin where the catheter enters your body. Clean downward and wipe away from the catheter in small circles. Do not wipe toward the catheter. °4. Pat the area dry with a clean towel. Make sure to clean off all soap. °How to care for your drainage bags °Empty your drainage bag when it is ?-½ full or at least 2-3 times a day. Replace your drainage bag once a month or sooner if it starts to smell bad or look dirty. Do not clean your drainage bag unless told by your doctor. °Emptying a drainage bag ° °Supplies Needed °· Rubbing alcohol. °· Gauze pad or cotton ball. °· Tape or a leg strap. ° °Steps °1. Wash your hands with soap and water. °2. Separate (detach) the bag from your leg. °3. Hold the bag over  the toilet or a clean container. Keep the bag below your hips and bladder. This stops pee (urine) from going back into the tube. °4. Open the pour spout at the bottom of the bag. °5. Empty the pee into the toilet or container. Do not let the pour spout touch any surface. °6. Put rubbing alcohol on a gauze pad or cotton ball. °7. Use the gauze pad or cotton ball to clean the pour spout. °8. Close the pour spout. °9. Attach the bag to your leg with tape or a leg strap. °10. Wash your hands. ° °Changing a drainage bag °Supplies Needed °· Alcohol wipes. °· A clean drainage bag. °· Adhesive tape or a leg strap. ° °Steps °1. Wash your hands with soap and water. °2. Separate the dirty bag from your leg. °3. Pinch the rubber catheter with your fingers so that pee does not spill out. °4. Separate the catheter tube from the drainage tube where these tubes connect (at the connection valve). Do not let the tubes touch any surface. °5. Clean the end of the catheter tube with an alcohol wipe. Use a different alcohol wipe to clean the end of the drainage tube. °6. Connect the catheter tube to the drainage tube of the clean bag. °7. Attach the new bag to   the leg with adhesive tape or a leg strap. °8. Wash your hands. ° °How to prevent infection and other problems °· Never pull on your catheter or try to remove it. Pulling can damage tissue in your body. °· Always wash your hands before and after touching your catheter. °· If a leg strap gets wet, replace it with a dry one. °· Drink enough fluids to keep your pee clear or pale yellow, or as told by your doctor. °· Do not let the drainage bag or tubing touch the floor. °· Wear cotton underwear. °· If you are male, wipe from front to back after you poop (have a bowel movement). °· Check on the catheter often to make sure it works and the tubing is not twisted. °Get help if: °· Your pee is cloudy. °· Your pee smells unusually bad. °· Your pee is not draining into the bag. °· Your  tube gets clogged. °· Your catheter starts to leak. °· Your bladder feels full. °Get help right away if: °· You have redness, swelling, or pain where the catheter enters your body. °· You have fluid, pus, or a bad smell coming from the area where the catheter enters your body. °· The area where the catheter enters your body feels warm. °· You have a fever. °· You have pain in your: °? Stomach (abdomen). °? Legs. °? Lower back. °? Bladder. °· You see blood fill the catheter. °· Your pee is pink or red. °· You feel sick to your stomach (nauseous). °· You throw up (vomit). °· You have chills. °· Your catheter gets pulled out. °This information is not intended to replace advice given to you by your health care provider. Make sure you discuss any questions you have with your health care provider. °Document Released: 06/30/2012 Document Revised: 02/01/2016 Document Reviewed: 08/18/2013 °Elsevier Interactive Patient Education © 2018 [employer] ° ° ° ° °Post Anesthesia Home Care Instructions ° °Activity: °Get plenty of rest for the remainder of the day. A responsible individual must stay with you for 24 hours following the procedure.  °For the next 24 hours, DO NOT: °-Drive a car °-Operate machinery °-Drink alcoholic beverages °-Take any medication unless instructed by your physician °-Make any legal decisions or sign important papers. ° °Meals: °Start with liquid foods such as gelatin or soup. Progress to regular foods as tolerated. Avoid greasy, spicy, heavy foods. If nausea and/or vomiting occur, drink only clear liquids until the nausea and/or vomiting subsides. Call your physician if vomiting continues. ° °Special Instructions/Symptoms: °Your throat may feel dry or sore from the anesthesia or the breathing tube placed in your throat during surgery. If this causes discomfort, gargle with warm salt water. The discomfort should disappear within 24 hours. ° °If you had a scopolamine patch placed behind your ear for the  management of post- operative nausea and/or vomiting: ° °1. The medication in the patch is effective for 72 hours, after which it should be removed.  Wrap patch in a tissue and discard in the trash. Wash hands thoroughly with soap and water. °2. You may remove the patch earlier than 72 hours if you experience unpleasant side effects which may include dry mouth, dizziness or visual disturbances. °3. Avoid touching the patch. Wash your hands with soap and water after contact with the patch. °  ° °

## 2016-08-17 NOTE — Anesthesia Procedure Notes (Signed)
Procedure Name: Intubation Date/Time: 08/17/2016 10:15 AM Performed by: Bethena Roys T Pre-anesthesia Checklist: Patient identified, Emergency Drugs available, Suction available and Patient being monitored Patient Re-evaluated:Patient Re-evaluated prior to inductionOxygen Delivery Method: Circle system utilized Preoxygenation: Pre-oxygenation with 100% oxygen Intubation Type: IV induction Ventilation: Mask ventilation without difficulty LMA: LMA inserted LMA Size: 5.0 Number of attempts: 1 Airway Equipment and Method: Bite block Placement Confirmation: ETT inserted through vocal cords under direct vision,  positive ETCO2 and breath sounds checked- equal and bilateral Tube secured with: Tape Dental Injury: Teeth and Oropharynx as per pre-operative assessment

## 2016-08-20 ENCOUNTER — Encounter (HOSPITAL_BASED_OUTPATIENT_CLINIC_OR_DEPARTMENT_OTHER): Payer: Self-pay | Admitting: Urology

## 2016-08-20 DIAGNOSIS — C61 Malignant neoplasm of prostate: Secondary | ICD-10-CM | POA: Diagnosis not present

## 2016-08-21 ENCOUNTER — Ambulatory Visit: Payer: Commercial Managed Care - PPO | Admitting: Urology

## 2016-08-31 DIAGNOSIS — R7301 Impaired fasting glucose: Secondary | ICD-10-CM | POA: Diagnosis not present

## 2016-08-31 DIAGNOSIS — E782 Mixed hyperlipidemia: Secondary | ICD-10-CM | POA: Diagnosis not present

## 2016-09-04 DIAGNOSIS — R7301 Impaired fasting glucose: Secondary | ICD-10-CM | POA: Diagnosis not present

## 2016-09-04 DIAGNOSIS — N200 Calculus of kidney: Secondary | ICD-10-CM | POA: Diagnosis not present

## 2016-09-04 DIAGNOSIS — Z1389 Encounter for screening for other disorder: Secondary | ICD-10-CM | POA: Diagnosis not present

## 2016-09-04 DIAGNOSIS — E782 Mixed hyperlipidemia: Secondary | ICD-10-CM | POA: Diagnosis not present

## 2016-09-06 ENCOUNTER — Telehealth: Payer: Self-pay | Admitting: *Deleted

## 2016-09-06 NOTE — Telephone Encounter (Signed)
CALLED PATIENT TO REMIND OF POST SEED APPT. FOR 09-07-16, SPOKE WITH PATIENT AND HE IS AWARE OF THIS APPT.

## 2016-09-07 ENCOUNTER — Ambulatory Visit
Admission: RE | Admit: 2016-09-07 | Discharge: 2016-09-07 | Disposition: A | Discharge status: Home / Self Care | Payer: Commercial Managed Care - PPO | Source: Ambulatory Visit | Attending: Radiation Oncology | Admitting: Radiation Oncology

## 2016-09-07 ENCOUNTER — Encounter: Payer: Self-pay | Admitting: Radiation Oncology

## 2016-09-07 ENCOUNTER — Ambulatory Visit
Admission: RE | Admit: 2016-09-07 | Discharge: 2016-09-07 | Disposition: A | Discharge status: Home / Self Care | Payer: Commercial Managed Care - PPO | Source: Ambulatory Visit | Attending: Urology | Admitting: Urology

## 2016-09-07 DIAGNOSIS — K4020 Bilateral inguinal hernia, without obstruction or gangrene, not specified as recurrent: Secondary | ICD-10-CM | POA: Insufficient documentation

## 2016-09-07 DIAGNOSIS — E7800 Pure hypercholesterolemia, unspecified: Secondary | ICD-10-CM | POA: Insufficient documentation

## 2016-09-07 DIAGNOSIS — Z8042 Family history of malignant neoplasm of prostate: Secondary | ICD-10-CM | POA: Insufficient documentation

## 2016-09-07 DIAGNOSIS — C61 Malignant neoplasm of prostate: Secondary | ICD-10-CM | POA: Diagnosis not present

## 2016-09-07 DIAGNOSIS — F329 Major depressive disorder, single episode, unspecified: Secondary | ICD-10-CM | POA: Insufficient documentation

## 2016-09-07 DIAGNOSIS — M329 Systemic lupus erythematosus, unspecified: Secondary | ICD-10-CM | POA: Insufficient documentation

## 2016-09-07 DIAGNOSIS — Z87891 Personal history of nicotine dependence: Secondary | ICD-10-CM | POA: Diagnosis not present

## 2016-09-07 DIAGNOSIS — I10 Essential (primary) hypertension: Secondary | ICD-10-CM | POA: Insufficient documentation

## 2016-09-07 DIAGNOSIS — Z8051 Family history of malignant neoplasm of kidney: Secondary | ICD-10-CM | POA: Insufficient documentation

## 2016-09-07 DIAGNOSIS — N201 Calculus of ureter: Secondary | ICD-10-CM | POA: Diagnosis not present

## 2016-09-07 DIAGNOSIS — R300 Dysuria: Secondary | ICD-10-CM

## 2016-09-07 DIAGNOSIS — Z951 Presence of aortocoronary bypass graft: Secondary | ICD-10-CM | POA: Insufficient documentation

## 2016-09-07 DIAGNOSIS — I700 Atherosclerosis of aorta: Secondary | ICD-10-CM | POA: Insufficient documentation

## 2016-09-07 DIAGNOSIS — Z7982 Long term (current) use of aspirin: Secondary | ICD-10-CM | POA: Insufficient documentation

## 2016-09-07 DIAGNOSIS — I252 Old myocardial infarction: Secondary | ICD-10-CM | POA: Diagnosis not present

## 2016-09-07 LAB — URINALYSIS, MICROSCOPIC - CHCC
Bilirubin (Urine): NEGATIVE
GLUCOSE UR CHCC: NEGATIVE mg/dL
Ketones: NEGATIVE mg/dL
LEUKOCYTE ESTERASE: NEGATIVE
NITRITE: NEGATIVE
PH: 6 (ref 4.6–8.0)
Protein: NEGATIVE mg/dL
SPECIFIC GRAVITY, URINE: 1.01 (ref 1.003–1.035)
UROBILINOGEN UR: 0.2 mg/dL (ref 0.2–1)

## 2016-09-07 MED ORDER — TAMSULOSIN HCL 0.4 MG PO CAPS: 0.4000 mg | ORAL_CAPSULE | Freq: Every day | ORAL | 4 refills | Status: DC

## 2016-09-07 NOTE — Progress Notes (Addendum)
Luis Griffin is here for follow up.  He reports having pain with urination that feels like he "is passing razor blades" that worsens at the day progresses  He also reports having to wait for his urinary stream to start.  The pain starts towards the end of the stream.  He also has a lot of burning and itching sensation in his urethra.  He reports having urinary urgency and frequency.  He occasionally with only urinate small amounts.  He reports having nocturia 1-2 times per night.  He also said he was found to have a kidney stone in his ureter and is wondering if this is causing his symptoms.  He denies having hematuria.  IPSS score of 23.  BP (!) 143/88 (BP Location: Left Arm, Patient Position: Sitting)   Pulse 62   Temp 98.2 F (36.8 C) (Oral)   Ht 5\' 11"  (1.803 m)   Wt 212 lb (96.2 kg)   SpO2 98%   BMI 29.57 kg/m    Wt Readings from Last 3 Encounters:  09/07/16 212 lb (96.2 kg)  08/17/16 209 lb (94.8 kg)  05/31/16 211 lb 3.2 oz (95.8 kg)

## 2016-09-07 NOTE — Progress Notes (Signed)
  Radiation Oncology         [phone] ________________________________  Name: NICKHOLAS GOLDSTON MRN: [id number]  Date: 09/07/2016  DOB: [birth year]  COMPLEX SIMULATION NOTE  NARRATIVE:  The patient was brought to the [facility] today following prostate seed implantation approximately one month ago.  Identity was confirmed.  All relevant records and images related to the planned course of therapy were reviewed.  Then, the patient was set-up supine.  CT images were obtained.  The CT images were loaded into the planning software.  Then the prostate and rectum were contoured.  Treatment planning then occurred.  The implanted iodine 125 seeds were identified by the physics staff for projection of radiation distribution  I have requested : 3D Simulation  I have requested a DVH of the following structures: Prostate and rectum.    ________________________________  Sheral Apley Tammi Klippel, M.D.  This document serves as a record of services personally performed by Tyler Pita, MD. It was created on his behalf by Darcus Austin, a trained medical scribe. The creation of this record is based on the scribe's personal observations and the provider's statements to them. This document has been checked and approved by the attending provider.

## 2016-09-07 NOTE — Progress Notes (Addendum)
Radiation Oncology         [phone] ________________________________  Name: Luis Griffin MRN: [id number]  Date: 09/07/2016  DOB: 25-Feb-1956  Post Treatment Note  CC: Curlene Labrum, MD  Cleon Gustin, MD  Diagnosis:   61 y.o. gentleman with Stage T1c adenocarcinoma of the prostate with Gleason Score of 4+3=7, and PSA of 5.0.  Interval Since Last Radiation:  3 weeks  08/17/16:  Insertion of radioactive I-125 seeds into the prostate gland. 145 Gy, definitive therapy.  Narrative:  The patient returns today for routine follow-up.             On review of systems, the patient states that he tolerated the procedure well but continues with significant lower urinary tract symptoms.   He denies gross hematuria, incomplete emptying and, fever or chills. He continues with dysuria, mainly at the end of his stream which seems to progressively worsen throughout the day, increased frequency, urgency, hesitancy and weak stream. He reports nocturia 1-2 times per night. His current IPSS is 23 as compared to 3 prior to treatment. He reports chronic constipation which has remained unchanged. He denies abdominal pain, diarrhea, nausea or vomiting.                  ALLERGIES:  is allergic to chantix [varenicline].  Meds: Current Outpatient Prescriptions  Medication Sig Dispense Refill  . aspirin 325 MG tablet Take 325 mg by mouth daily.    Marland Kitchen atenolol (TENORMIN) 25 MG tablet Take 25 mg by mouth 2 (two) times daily.     Marland Kitchen atorvastatin (LIPITOR) 10 MG tablet Take 10 mg by mouth every evening.     . ezetimibe (ZETIA) 10 MG tablet Take 10 mg by mouth every evening.     Marland Kitchen lisinopril (PRINIVIL,ZESTRIL) 10 MG tablet Take 10 mg by mouth every evening.     . clopidogrel (PLAVIX) 75 MG tablet     . HYDROcodone-acetaminophen (NORCO) 5-325 MG tablet Take 1 tablet by mouth every 6 (six) hours as needed for moderate pain. (Patient not taking: Reported on 09/07/2016) 30 tablet 0  . tamsulosin (FLOMAX) 0.4  MG CAPS capsule Take 1 capsule (0.4 mg total) by mouth daily after supper. 30 capsule 4   No current facility-administered medications for this encounter.     Physical Findings:  height is 5\' 11"  (1.803 m) and weight is 212 lb (96.2 kg). His oral temperature is 98.2 F (36.8 C). His blood pressure is 143/88 (abnormal) and his pulse is 62. His oxygen saturation is 98%.  Pain Assessment Pain Score: 0-No pain/10 In general this is a well appearing caucasian male in no acute distress. He's alert and oriented x4 and appropriate throughout the examination. Cardiopulmonary assessment is negative for acute distress and he exhibits normal effort.   Lab Findings: Lab Results  Component Value Date   WBC 9.6 08/10/2016   HGB 18.1 (H) 08/10/2016   HCT 51.2 08/10/2016   MCV 88.3 08/10/2016   PLT 219 08/10/2016     Radiographic Findings: Dg C-arm 1-60 Min-no Report  Result Date: 08/17/2016 Fluoroscopy was utilized by the requesting physician.  No radiographic interpretation.    Impression/Plan: 1. 61 y.o. gentleman with Stage T1c adenocarcinoma of the prostate with Gleason Score of 4+3=7, and PSA of 5.0. The patient is recovering well from the effects radiation. He is reassured that his urinary symptoms should continue to gradually improve over the next 4-6 months. He is encouraged by his improvement already and  is otherwise pleased with his outcome.  We will check a urinalysis and urine culture today to rule out urinary tract infection in light of his persistent dysuria. We will await the culture report prior to starting antibiotic therapy.  Today we spent time talking to the patient about his prostate seed implant and resolving urinary symptoms. We also talked about long-term follow-up of prostate cancer following seed implant. He understands that ongoing PSA determinations and digital rectal exams will help perform surveillance to rule out disease recurrence. He understands what to expect with his  PSA measures going forward. He was also educated about some of the long-term effects from radiation including a small risk for rectal bleeding and possible erectile dysfunction. We talked about some of the general management approaches to these potential complications. His post-implant CT images were reviewed by Dr. Tammi Klippel and appear to have excellent coverage.  A copy of the detailed report will be forwarded to his urologist.  He is encouraged to contact our office or return at any point if he has any questions or concerns related to his previous radiation or prostate cancer. We look forward to continuing to follow his response to treatment through urology correspondence and are happy to see him back as needed.    Nicholos Johns, PA-C

## 2016-09-07 NOTE — Addendum Note (Signed)
Encounter addended by: Freeman Caldron, PA-C on: 09/07/2016  2:30 PM<BR>    Actions taken: Sign clinical note

## 2016-09-08 LAB — URINE CULTURE: Organism ID, Bacteria: NO GROWTH

## 2016-09-10 DIAGNOSIS — I2583 Coronary atherosclerosis due to lipid rich plaque: Secondary | ICD-10-CM | POA: Diagnosis not present

## 2016-09-10 DIAGNOSIS — I2510 Atherosclerotic heart disease of native coronary artery without angina pectoris: Secondary | ICD-10-CM | POA: Diagnosis not present

## 2016-09-10 DIAGNOSIS — I252 Old myocardial infarction: Secondary | ICD-10-CM | POA: Insufficient documentation

## 2016-09-10 DIAGNOSIS — I5189 Other ill-defined heart diseases: Secondary | ICD-10-CM | POA: Diagnosis not present

## 2016-09-10 DIAGNOSIS — I080 Rheumatic disorders of both mitral and aortic valves: Secondary | ICD-10-CM | POA: Diagnosis not present

## 2016-09-10 DIAGNOSIS — Z951 Presence of aortocoronary bypass graft: Secondary | ICD-10-CM | POA: Diagnosis not present

## 2016-09-10 DIAGNOSIS — I517 Cardiomegaly: Secondary | ICD-10-CM | POA: Diagnosis not present

## 2016-09-11 ENCOUNTER — Telehealth: Payer: Self-pay | Admitting: Radiation Oncology

## 2016-09-11 NOTE — Telephone Encounter (Signed)
As requested by patient delivered CRITICAL ILLNESS CLAIM paperwork to [facility], front desk/check in staff, at [facility]. Anderson Malta understands the patient belong to Dr. Alyson Ingles.

## 2016-09-21 DIAGNOSIS — Z8042 Family history of malignant neoplasm of prostate: Secondary | ICD-10-CM | POA: Diagnosis not present

## 2016-09-21 DIAGNOSIS — C61 Malignant neoplasm of prostate: Secondary | ICD-10-CM | POA: Diagnosis not present

## 2016-09-24 ENCOUNTER — Encounter: Payer: Self-pay | Admitting: Radiation Oncology

## 2016-09-29 NOTE — Progress Notes (Signed)
  Radiation Oncology         [phone] ________________________________  Name: [name] MRN: [id number]  Date: 09/24/2016  DOB: 1956-02-21  3D Planning Note   Prostate Brachytherapy Post-Implant Dosimetry  Diagnosis: 61 y.o. gentleman with Stage T1c adenocarcinoma of the prostate with Gleason Score of 4+3=7, and PSA of 5.0.   Narrative: On a previous date, Luis Griffin returned following prostate seed implantation for post implant planning. He underwent CT scan complex simulation to delineate the three-dimensional structures of the pelvis and demonstrate the radiation distribution.  Since that time, the seed localization, and complex isodose planning with dose volume histograms have now been completed.  Results:   Prostate Coverage - The dose of radiation delivered to the 90% or more of the prostate gland (D90) was 107.59% of the prescription dose. This exceeds our goal of greater than 90%. Rectal Sparing - The volume of rectal tissue receiving the prescription dose or higher was 0.0 cc. This falls under our thresholds tolerance of 1.0 cc.  Impression: The prostate seed implant appears to show adequate target coverage and appropriate rectal sparing.  Plan:  The patient will continue to follow with urology for ongoing PSA determinations. I would anticipate a high likelihood for local tumor control with minimal risk for rectal morbidity.  ________________________________  Sheral Apley Tammi Klippel, M.D.

## 2016-11-21 ENCOUNTER — Ambulatory Visit (INDEPENDENT_AMBULATORY_CARE_PROVIDER_SITE_OTHER): Payer: Commercial Managed Care - PPO | Admitting: Urology

## 2016-11-21 DIAGNOSIS — C61 Malignant neoplasm of prostate: Secondary | ICD-10-CM | POA: Diagnosis not present

## 2016-12-31 NOTE — Addendum Note (Signed)
Encounter addended by: Heywood Footman, RN on: 12/31/2016  9:19 AM<BR>    Actions taken: Visit Navigator Flowsheet section accepted

## 2017-02-11 DIAGNOSIS — C61 Malignant neoplasm of prostate: Secondary | ICD-10-CM | POA: Diagnosis not present

## 2017-02-11 DIAGNOSIS — I7090 Unspecified atherosclerosis: Secondary | ICD-10-CM | POA: Diagnosis not present

## 2017-02-11 DIAGNOSIS — R7301 Impaired fasting glucose: Secondary | ICD-10-CM | POA: Diagnosis not present

## 2017-02-11 DIAGNOSIS — E782 Mixed hyperlipidemia: Secondary | ICD-10-CM | POA: Diagnosis not present

## 2017-02-19 DIAGNOSIS — L280 Lichen simplex chronicus: Secondary | ICD-10-CM | POA: Diagnosis not present

## 2017-02-19 DIAGNOSIS — D1801 Hemangioma of skin and subcutaneous tissue: Secondary | ICD-10-CM | POA: Diagnosis not present

## 2017-02-19 DIAGNOSIS — L570 Actinic keratosis: Secondary | ICD-10-CM | POA: Diagnosis not present

## 2017-02-20 ENCOUNTER — Ambulatory Visit (INDEPENDENT_AMBULATORY_CARE_PROVIDER_SITE_OTHER): Payer: Commercial Managed Care - PPO | Admitting: Urology

## 2017-02-20 DIAGNOSIS — C61 Malignant neoplasm of prostate: Secondary | ICD-10-CM | POA: Diagnosis not present

## 2017-02-20 DIAGNOSIS — R3915 Urgency of urination: Secondary | ICD-10-CM

## 2017-02-27 DIAGNOSIS — E782 Mixed hyperlipidemia: Secondary | ICD-10-CM | POA: Diagnosis not present

## 2017-02-27 DIAGNOSIS — C61 Malignant neoplasm of prostate: Secondary | ICD-10-CM | POA: Diagnosis not present

## 2017-02-27 DIAGNOSIS — I240 Acute coronary thrombosis not resulting in myocardial infarction: Secondary | ICD-10-CM | POA: Diagnosis not present

## 2017-05-28 DIAGNOSIS — C61 Malignant neoplasm of prostate: Secondary | ICD-10-CM | POA: Diagnosis not present

## 2017-06-05 ENCOUNTER — Ambulatory Visit: Payer: Commercial Managed Care - PPO | Admitting: Urology

## 2017-06-05 DIAGNOSIS — R3915 Urgency of urination: Secondary | ICD-10-CM | POA: Diagnosis not present

## 2017-06-05 DIAGNOSIS — C61 Malignant neoplasm of prostate: Secondary | ICD-10-CM | POA: Diagnosis not present

## 2017-08-05 DIAGNOSIS — N1330 Unspecified hydronephrosis: Secondary | ICD-10-CM | POA: Diagnosis not present

## 2017-08-05 DIAGNOSIS — N202 Calculus of kidney with calculus of ureter: Secondary | ICD-10-CM | POA: Diagnosis not present

## 2017-08-05 DIAGNOSIS — R109 Unspecified abdominal pain: Secondary | ICD-10-CM | POA: Diagnosis not present

## 2017-08-05 DIAGNOSIS — N132 Hydronephrosis with renal and ureteral calculous obstruction: Secondary | ICD-10-CM | POA: Diagnosis not present

## 2017-08-05 DIAGNOSIS — D72829 Elevated white blood cell count, unspecified: Secondary | ICD-10-CM | POA: Diagnosis not present

## 2017-08-05 DIAGNOSIS — M549 Dorsalgia, unspecified: Secondary | ICD-10-CM | POA: Diagnosis not present

## 2017-08-05 DIAGNOSIS — N200 Calculus of kidney: Secondary | ICD-10-CM | POA: Diagnosis not present

## 2017-08-20 DIAGNOSIS — R9431 Abnormal electrocardiogram [ECG] [EKG]: Secondary | ICD-10-CM | POA: Diagnosis not present

## 2017-08-20 DIAGNOSIS — I517 Cardiomegaly: Secondary | ICD-10-CM | POA: Diagnosis not present

## 2017-08-20 DIAGNOSIS — I5189 Other ill-defined heart diseases: Secondary | ICD-10-CM | POA: Diagnosis not present

## 2017-08-29 DIAGNOSIS — I2583 Coronary atherosclerosis due to lipid rich plaque: Secondary | ICD-10-CM | POA: Diagnosis not present

## 2017-08-29 DIAGNOSIS — Z951 Presence of aortocoronary bypass graft: Secondary | ICD-10-CM | POA: Diagnosis not present

## 2017-08-29 DIAGNOSIS — I2510 Atherosclerotic heart disease of native coronary artery without angina pectoris: Secondary | ICD-10-CM | POA: Diagnosis not present

## 2017-09-04 ENCOUNTER — Ambulatory Visit: Payer: Commercial Managed Care - PPO | Admitting: Urology

## 2017-09-04 DIAGNOSIS — N201 Calculus of ureter: Secondary | ICD-10-CM

## 2017-09-04 DIAGNOSIS — R3915 Urgency of urination: Secondary | ICD-10-CM | POA: Diagnosis not present

## 2017-09-04 DIAGNOSIS — C61 Malignant neoplasm of prostate: Secondary | ICD-10-CM | POA: Diagnosis not present

## 2017-09-05 ENCOUNTER — Other Ambulatory Visit: Payer: Self-pay | Admitting: Urology

## 2017-09-05 DIAGNOSIS — N201 Calculus of ureter: Secondary | ICD-10-CM

## 2017-09-11 DIAGNOSIS — I7090 Unspecified atherosclerosis: Secondary | ICD-10-CM | POA: Diagnosis not present

## 2017-09-11 DIAGNOSIS — Z720 Tobacco use: Secondary | ICD-10-CM | POA: Diagnosis not present

## 2017-09-11 DIAGNOSIS — E782 Mixed hyperlipidemia: Secondary | ICD-10-CM | POA: Diagnosis not present

## 2017-09-17 DIAGNOSIS — Z0001 Encounter for general adult medical examination with abnormal findings: Secondary | ICD-10-CM | POA: Diagnosis not present

## 2017-09-17 DIAGNOSIS — E782 Mixed hyperlipidemia: Secondary | ICD-10-CM | POA: Diagnosis not present

## 2017-09-17 DIAGNOSIS — Z720 Tobacco use: Secondary | ICD-10-CM | POA: Diagnosis not present

## 2017-09-18 ENCOUNTER — Other Ambulatory Visit (HOSPITAL_COMMUNITY): Payer: Commercial Managed Care - PPO

## 2017-09-18 ENCOUNTER — Ambulatory Visit (HOSPITAL_COMMUNITY)
Admission: RE | Admit: 2017-09-18 | Discharge: 2017-09-18 | Disposition: A | Discharge status: Home / Self Care | Payer: Commercial Managed Care - PPO | Source: Ambulatory Visit | Attending: Urology | Admitting: Urology

## 2017-09-18 DIAGNOSIS — N201 Calculus of ureter: Secondary | ICD-10-CM | POA: Insufficient documentation

## 2017-09-18 DIAGNOSIS — N281 Cyst of kidney, acquired: Secondary | ICD-10-CM | POA: Insufficient documentation

## 2017-09-20 ENCOUNTER — Ambulatory Visit (HOSPITAL_COMMUNITY)
Admission: RE | Admit: 2017-09-20 | Discharge: 2017-09-20 | Disposition: A | Discharge status: Home / Self Care | Payer: Commercial Managed Care - PPO | Source: Ambulatory Visit | Attending: Urology | Admitting: Urology

## 2017-09-20 ENCOUNTER — Other Ambulatory Visit: Payer: Self-pay | Admitting: Urology

## 2017-09-20 ENCOUNTER — Ambulatory Visit: Payer: Commercial Managed Care - PPO | Admitting: Urology

## 2017-09-20 DIAGNOSIS — N201 Calculus of ureter: Secondary | ICD-10-CM | POA: Diagnosis not present

## 2017-11-28 DIAGNOSIS — N201 Calculus of ureter: Secondary | ICD-10-CM | POA: Diagnosis not present

## 2017-11-28 DIAGNOSIS — C61 Malignant neoplasm of prostate: Secondary | ICD-10-CM | POA: Diagnosis not present

## 2017-11-28 DIAGNOSIS — M549 Dorsalgia, unspecified: Secondary | ICD-10-CM | POA: Diagnosis not present

## 2017-12-11 ENCOUNTER — Ambulatory Visit: Payer: Commercial Managed Care - PPO | Admitting: Urology

## 2017-12-18 ENCOUNTER — Ambulatory Visit: Payer: Commercial Managed Care - PPO | Admitting: Urology

## 2018-03-24 DIAGNOSIS — E782 Mixed hyperlipidemia: Secondary | ICD-10-CM | POA: Diagnosis not present

## 2018-03-24 DIAGNOSIS — Z720 Tobacco use: Secondary | ICD-10-CM | POA: Diagnosis not present

## 2018-03-24 DIAGNOSIS — R7301 Impaired fasting glucose: Secondary | ICD-10-CM | POA: Diagnosis not present

## 2018-03-27 DIAGNOSIS — R7301 Impaired fasting glucose: Secondary | ICD-10-CM | POA: Diagnosis not present

## 2018-03-27 DIAGNOSIS — E782 Mixed hyperlipidemia: Secondary | ICD-10-CM | POA: Diagnosis not present

## 2018-03-27 DIAGNOSIS — C61 Malignant neoplasm of prostate: Secondary | ICD-10-CM | POA: Diagnosis not present

## 2018-04-16 ENCOUNTER — Ambulatory Visit: Payer: Commercial Managed Care - PPO | Admitting: Urology

## 2018-04-16 DIAGNOSIS — R3915 Urgency of urination: Secondary | ICD-10-CM | POA: Diagnosis not present

## 2018-04-16 DIAGNOSIS — N401 Enlarged prostate with lower urinary tract symptoms: Secondary | ICD-10-CM

## 2018-04-16 DIAGNOSIS — R972 Elevated prostate specific antigen [PSA]: Secondary | ICD-10-CM | POA: Diagnosis not present

## 2018-04-16 DIAGNOSIS — C61 Malignant neoplasm of prostate: Secondary | ICD-10-CM

## 2018-10-02 ENCOUNTER — Other Ambulatory Visit (HOSPITAL_COMMUNITY): Payer: Self-pay | Admitting: Urology

## 2018-10-02 ENCOUNTER — Other Ambulatory Visit: Payer: Self-pay

## 2018-10-02 ENCOUNTER — Ambulatory Visit (HOSPITAL_COMMUNITY)
Admission: RE | Admit: 2018-10-02 | Discharge: 2018-10-02 | Disposition: A | Discharge status: Home / Self Care | Payer: Commercial Managed Care - PPO | Source: Ambulatory Visit | Attending: Urology | Admitting: Urology

## 2018-10-02 ENCOUNTER — Other Ambulatory Visit (HOSPITAL_COMMUNITY)
Admission: RE | Admit: 2018-10-02 | Discharge: 2018-10-02 | Disposition: A | Discharge status: Home / Self Care | Payer: Commercial Managed Care - PPO | Source: Ambulatory Visit | Attending: Urology | Admitting: Urology

## 2018-10-02 DIAGNOSIS — N200 Calculus of kidney: Secondary | ICD-10-CM | POA: Diagnosis present

## 2018-10-02 DIAGNOSIS — N411 Chronic prostatitis: Secondary | ICD-10-CM | POA: Diagnosis present

## 2018-10-02 LAB — PSA: Prostatic Specific Antigen: 0.75 ng/mL (ref 0.00–4.00)

## 2018-10-08 ENCOUNTER — Ambulatory Visit (INDEPENDENT_AMBULATORY_CARE_PROVIDER_SITE_OTHER): Payer: Commercial Managed Care - PPO | Admitting: Urology

## 2018-10-08 DIAGNOSIS — C61 Malignant neoplasm of prostate: Secondary | ICD-10-CM | POA: Diagnosis not present

## 2018-10-08 DIAGNOSIS — N401 Enlarged prostate with lower urinary tract symptoms: Secondary | ICD-10-CM | POA: Diagnosis not present

## 2018-10-08 DIAGNOSIS — N201 Calculus of ureter: Secondary | ICD-10-CM

## 2018-11-29 IMAGING — CR DG CHEST 2V
2 series · 2 of 2 positions shown · non-contrast
Comparison: Chest x-ray 05/04/2005 .

CLINICAL DATA: Prostate cancer.  Preoperative exam.

EXAM:
CHEST  2 VIEW

[w chest pa]
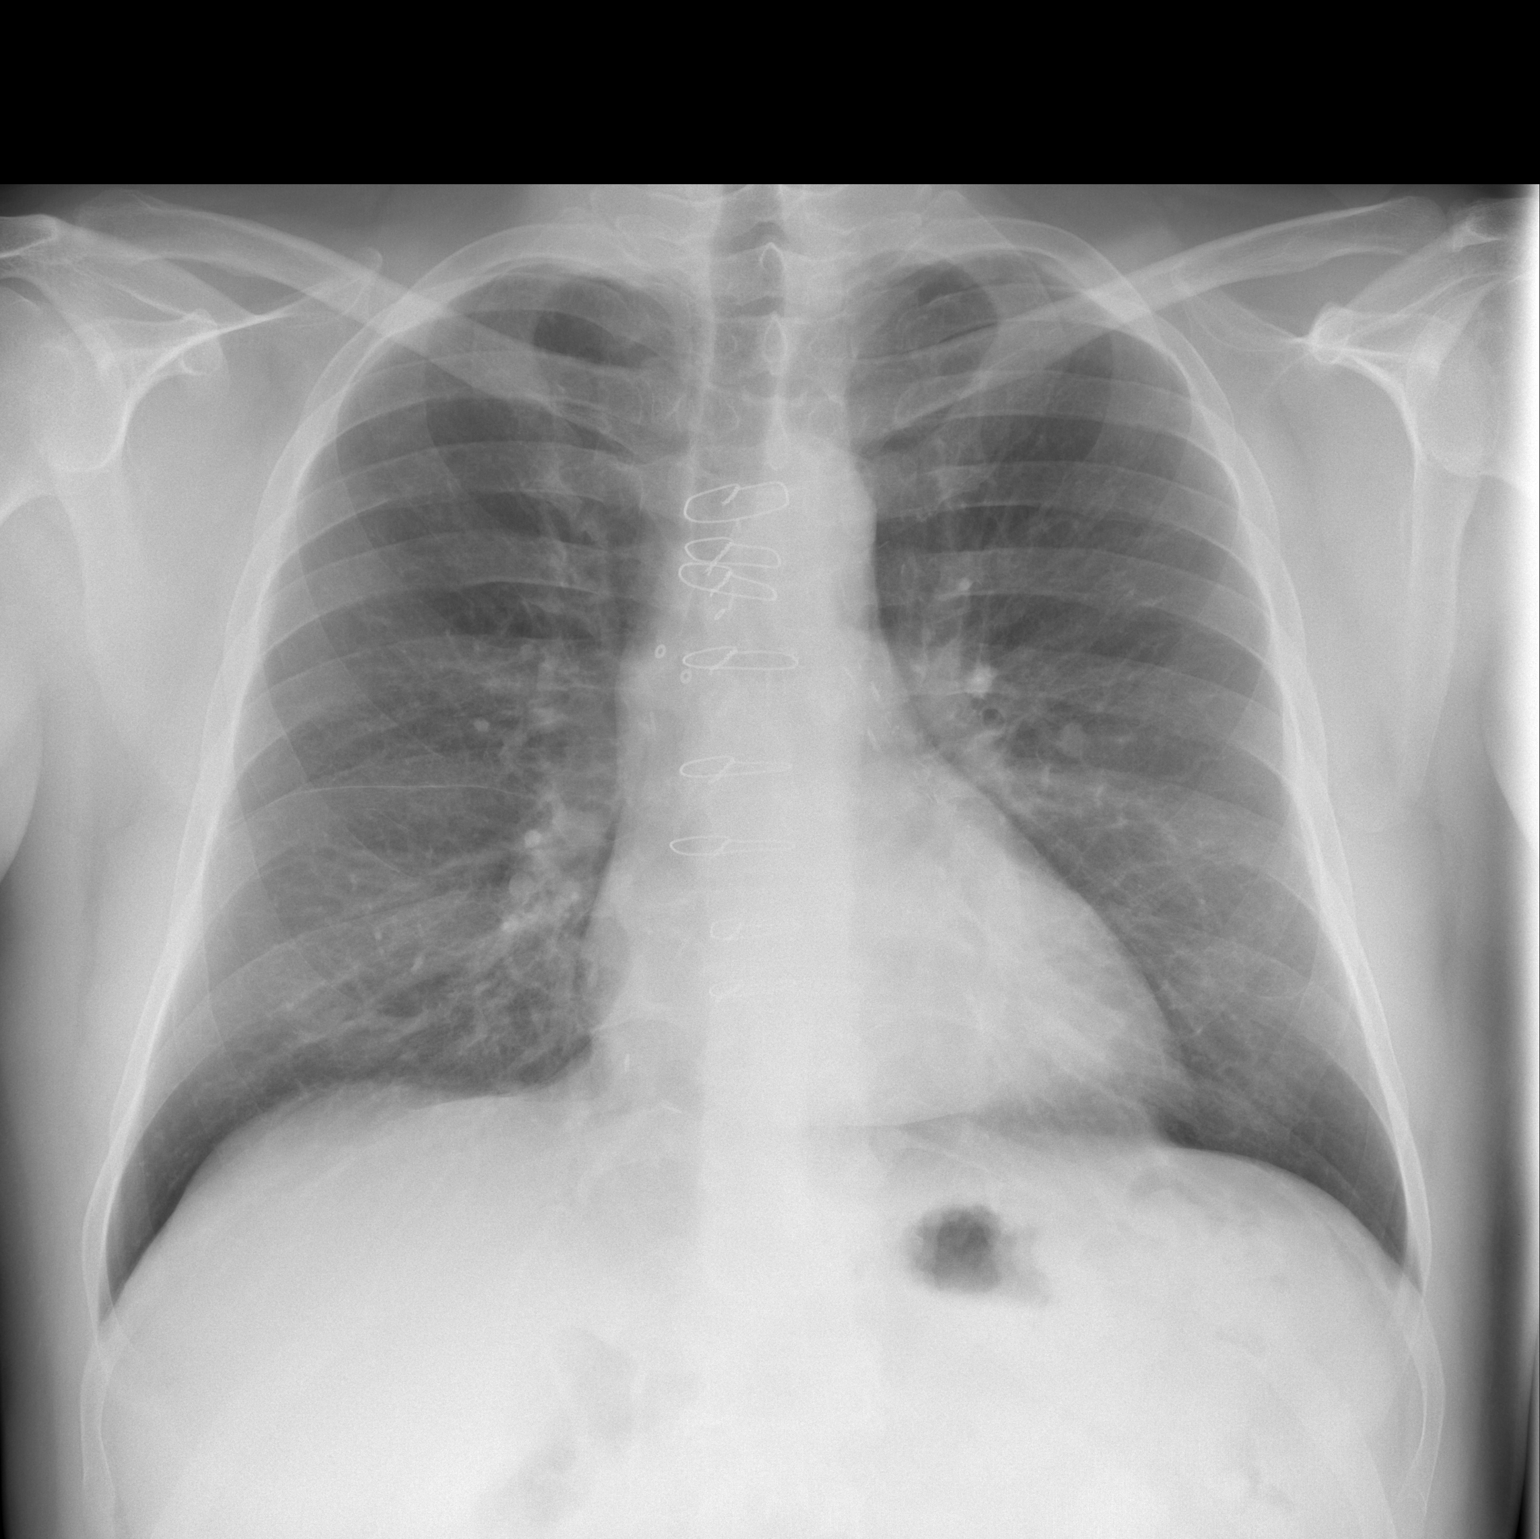

[w chest lat]
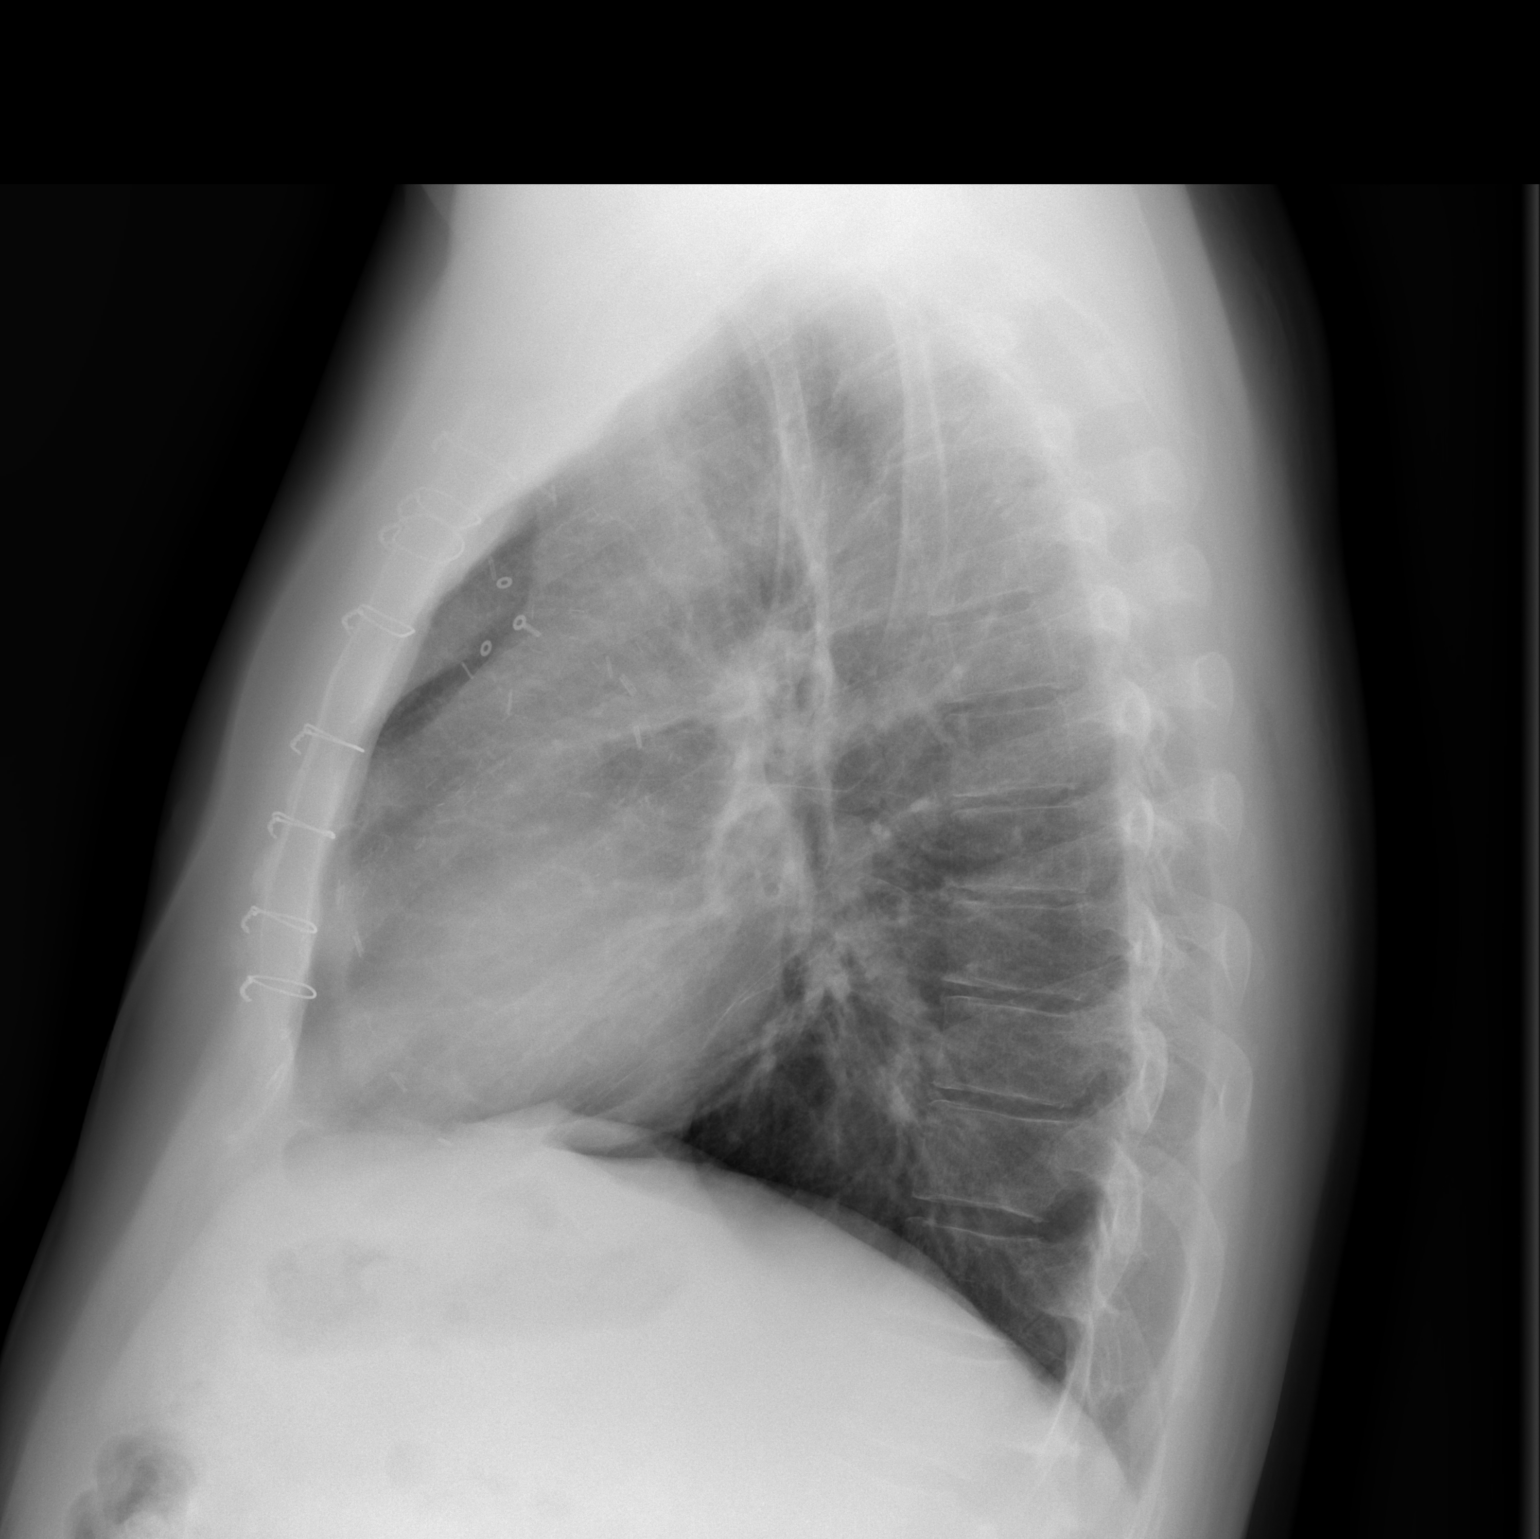

[2 of 2 positions shown; findings below may reference images not displayed]

FINDINGS: Mediastinum and hilar structures normal. Prior CABG. Heart size
normal. Questionable small nodule left mid lung. Although this may
be vascular a a pulmonary nodule cannot be excluded and nonenhanced
chest CT is suggested to further evaluate. No pleural effusion or
pneumothorax. No acute bony abnormality.
IMPRESSION: 1. Questionable small nodule left mid lung. Although this may be
vascular, a pulmonary nodule cannot be excluded. Nonenhanced chest
CT suggest for further evaluation. No acute pulmonary disease.

2. Prior CABG.  Heart size stable.

## 2019-04-22 ENCOUNTER — Ambulatory Visit (INDEPENDENT_AMBULATORY_CARE_PROVIDER_SITE_OTHER): Payer: Commercial Managed Care - PPO | Admitting: Urology

## 2019-04-22 ENCOUNTER — Other Ambulatory Visit: Payer: Self-pay

## 2019-04-22 ENCOUNTER — Encounter: Payer: Self-pay | Admitting: Urology

## 2019-04-22 VITALS — BP 110/69 | HR 65 | Temp 97.9°F | Ht 73.0 in | Wt 208.0 lb

## 2019-04-22 DIAGNOSIS — R3912 Poor urinary stream: Secondary | ICD-10-CM | POA: Diagnosis not present

## 2019-04-22 DIAGNOSIS — N138 Other obstructive and reflux uropathy: Secondary | ICD-10-CM

## 2019-04-22 DIAGNOSIS — C61 Malignant neoplasm of prostate: Secondary | ICD-10-CM

## 2019-04-22 DIAGNOSIS — N401 Enlarged prostate with lower urinary tract symptoms: Secondary | ICD-10-CM

## 2019-04-22 DIAGNOSIS — N200 Calculus of kidney: Secondary | ICD-10-CM | POA: Diagnosis not present

## 2019-04-22 MED ORDER — TAMSULOSIN HCL 0.4 MG PO CAPS
0.4000 mg | ORAL_CAPSULE | Freq: Two times a day (BID) | ORAL | 3 refills | Status: DC
Start: 2019-04-22 — End: 2019-10-21

## 2019-04-22 NOTE — Patient Instructions (Signed)
Prostate Cancer  The prostate is a male gland that helps make semen. Prostate cancer is when abnormal cells grow in this gland. Follow these instructions at home:  Take over-the-counter and prescription medicines only as told by your doctor.  Eat a healthy diet.  Get plenty of sleep.  Ask your doctor for help to find a support group for men with prostate cancer.  Keep all follow-up visits as told by your doctor. This is important.  If you have to go to the hospital, let your cancer doctor (oncologist) know.  Touch, hold, hug, and caress your partner to continue to show sexual feelings. Contact a doctor if:  You have trouble peeing (urinating).  You have blood in your pee (urine).  You have pain in your hips, back, or chest. Get help right away if:  You have weakness in your legs.  You lose feeling (have numbness) in your legs.  You cannot control your pee or your poop (stool).  You have trouble breathing.  You have sudden pain in your chest.  You have chills or a fever. Summary  The prostate is a male gland that helps make semen. Prostate cancer is when abnormal cells grow in this gland.  Ask your doctor for help to find a support group for men with prostate cancer.  Contact a doctor if you have problems peeing or have any new pain that you did not have before. This information is not intended to replace advice given to you by your health care provider. Make sure you discuss any questions you have with your health care provider. Document Revised: 02/15/2017 Document Reviewed: 11/14/2015 Elsevier Patient Education  2020 Elsevier Inc.  

## 2019-04-22 NOTE — Progress Notes (Signed)
Urological Symptom Review  Patient is experiencing the following symptoms: Hard to postpone urination   Review of Systems  Gastrointestinal (upper)  : Negative for upper GI symptoms  Gastrointestinal (lower) : Constipation  Constitutional : Negative for symptoms  Skin: Skin rash/lesion  Eyes: Negative for eye symptoms  Ear/Nose/Throat : Sinus problems  Hematologic/Lymphatic: Easy bruising  Cardiovascular : Negative for cardiovascular symptoms  Respiratory : Negative for respiratory symptoms  Endocrine: Negative for endocrine symptoms  Musculoskeletal: Joint pain  Neurological: Negative for neurological symptoms  Psychologic: Negative for psychiatric symptoms

## 2019-04-22 NOTE — Progress Notes (Signed)
04/22/2019 3:54 PM   Luis Griffin [birth year] [id number]  Referring provider: Curlene Labrum, MD [place],  [place] [address]  Prostate cancer and BPH with weak stream  HPI: Luis Griffin is a 64yo here for followup for prostate cancer and BPH with weak stream. No recent PSA. His stream has improved on flomax 0.18m daily. Nocturia 0-1x. NO frequency or urgency. BPH is stable. No hematuria or dysuria.   His records from [facility] are as follows: TDemitrious Mccannonis a 64year-old male established patient who is here evaluation for treatment of prostate cancer.  His prostate cancer was diagnosed 05/02/2016. He does have the pathology report from his biopsy. His cancer was diagnosed by Dr. MAlyson Ingles His PSA at his time of diagnosis was 5.0. His most recent PSA is 0.75.   He has not undergone surgery for treatment. He has not undergone External Beam Radiation Therapy for treatment. He has not undergone Hormonal Therapy for treatment.   He does not have urinary incontinence. He does have problems with erectile dysfunction. He has not recently had unwanted weight loss. He is not having pain in new locations.   05/16/2016: Biopsy revelaed Gleason 4+3=7 in 1/12 cores 60% of core and gleason 3+3=6 in 1/12 cores. He has new mild urgency since the biopsy.    06/13/2016: The patient met with Dr. MTammi Klippeland has elected for brachytherapy. He underwent metastatic survey which showed no evidence of metastatic disease. It did show a 457mright distal ureteral calculus   08/20/16: Patient presents today for voiding trial follow brachytherapy seed placement on 6/1. Overall, he is doing well. He denies any fevers. He states his catheter has been draining well.   11/21/2016: s/p brachytherapy on 5/31. He has urgency and occasional dysuria   02/20/2017: PSA has decreased to 0.5 from 0.9 3 months ago.   06/05/2017: PSA has decreased to 0.4 from 0.5   09/04/2017: PSA increased slightly to 0.5    04/16/2018: NO recent PSA. No new LUTS   10/08/2018: PSa increased to 0.75. no worsening LUTS     PMH: Past Medical History:  Diagnosis Date  . Coronary artery disease    cardiologsit-  dr jeDellis Filbertlevenger  . Depression   . Frequency of urination   . History of MI (myocardial infarction)   . Hyperlipidemia   . Hypertension   . OA (osteoarthritis)   . Productive cough   . Prostate cancer ([place]-  DR Saidah Kempton/  ONCOLOGIST-  DR MANNING   dx 02/ 2018,  Stage T1c,  PSA 5.0,  Gleason 4+3, vol 45cc  . S/P drug eluting coronary stent placement   . Smokers' cough ([place]  . Systemic lupus erythematosus ([place]  . Urgency of urination   . Wears hearing aid in both ears     Surgical History: Past Surgical History:  Procedure Laterality Date  . CARDIOVASCULAR STRESS TEST  06-03-2007   dr clevenger ([facility])   moderate sized fixed defect in the inferolateral wall probable scar (poss. attenuation artifact),  small area peri-infarct ischemia in the inferolateral region,  segment of inferolateral is akinetic , septal motion consistant w/ post-op changes,  ef 65%,  no evidence significant ischemia  . [facility]ON[facility]  . CORONARY ARTERY BYPASS GRAFT  2006   [place]   x4 per pt  . DOBUTAMINE STRESS ECHO  09/14/2015  dr j. cleveger ([facility])   normal stress echo-- moderate LVH, ef 55-60%, mild inferior wall hypokinesis,  mild AV sclerosis without stenosis,    . PROSTATE BIOPSY  05/02/2016  . RADIOACTIVE SEED IMPLANT N/A 08/17/2016   Procedure: RADIOACTIVE SEED IMPLANT/BRACHYTHERAPY IMPLANT;  Surgeon: Cleon Gustin, MD;  Location: [facility];  Service: Urology;  Laterality: N/A;  . TONSILLECTOMY  child  . WRIST GANGLION EXCISION Right 2015    Home Medications:  Allergies as of 04/22/2019      Reactions   Varenicline Other (See Comments)   Other reaction(s): Constipation Other  reaction(s): Constipation      Medication List       Accurate as of April 22, 2019  3:54 PM. If you have any questions, ask your nurse or doctor.        albuterol 108 (90 Base) MCG/ACT inhaler Commonly known as: VENTOLIN HFA INHALE 1 2 PUFFS EVERY 4 6 HOURS AS NEEDED FOR SHORTNESS OF BREATH OR WHEEZING   aspirin 325 MG tablet Take 325 mg by mouth daily.   aspirin 325 MG EC tablet Take by mouth.   atenolol 25 MG tablet Commonly known as: TENORMIN Take 25 mg by mouth 2 (two) times daily.   atorvastatin 10 MG tablet Commonly known as: LIPITOR Take 10 mg by mouth every evening.   buPROPion 150 MG 12 hr tablet Commonly known as: WELLBUTRIN SR Take 150 mg by mouth 2 (two) times daily.   clopidogrel 75 MG tablet Commonly known as: PLAVIX   diclofenac Sodium 1 % Gel Commonly known as: VOLTAREN Place onto the skin.   ezetimibe 10 MG tablet Commonly known as: ZETIA Take 10 mg by mouth every evening.   HYDROcodone-acetaminophen 5-325 MG tablet Commonly known as: Norco Take 1 tablet by mouth every 6 (six) hours as needed for moderate pain.   lisinopril 10 MG tablet Commonly known as: ZESTRIL Take 10 mg by mouth every evening.   nitroGLYCERIN 0.4 MG SL tablet Commonly known as: NITROSTAT Place under the tongue.   tamsulosin 0.4 MG Caps capsule Commonly known as: FLOMAX Take 1 capsule (0.4 mg total) by mouth daily after supper.       Allergies:  Allergies  Allergen Reactions  . Varenicline Other (See Comments)    Other reaction(s): Constipation Other reaction(s): Constipation    Family History: Family History  Problem Relation Age of Onset  . Cancer Father        prostate ca  . Kidney cancer Father     Social History:  reports that he has been smoking cigarettes. He has a 45.00 pack-year smoking history. His smokeless tobacco use includes snuff and chew. He reports current alcohol use. He reports that he does not use drugs.  ROS: All other review  of systems were reviewed and are negative except what is noted above in HPI  Physical Exam: BP 110/69   Pulse 65   Temp 97.9 F (36.6 C)   Ht 6' 1"  (1.854 m)   Wt 208 lb (94.3 kg)   BMI 27.44 kg/m   Constitutional:  Alert and oriented, No acute distress. HEENT: [place] AT, moist mucus membranes.  Trachea midline, no masses. Cardiovascular: No clubbing, cyanosis, or edema. Respiratory: Normal respiratory effort, no increased work of breathing. GI: Abdomen is soft, nontender, nondistended, no abdominal masses GU: No CVA tenderness Lymph: No cervical or inguinal lymphadenopathy. Skin: No rashes, bruises or suspicious lesions. Neurologic: Grossly intact, no focal deficits, moving all 4 extremities.  Psychiatric: Normal mood and affect.  Laboratory Data: Lab Results  Component Value Date   WBC 9.6 08/10/2016   HGB 18.1 (H) 08/10/2016   HCT 51.2 08/10/2016   MCV 88.3 08/10/2016   PLT 219 08/10/2016    Lab Results  Component Value Date   CREATININE 0.87 08/10/2016    No results found for: PSA  No results found for: TESTOSTERONE  No results found for: HGBA1C  Urinalysis    Component Value Date/Time   COLORURINE YELLOW 04/11/2016 1600   APPEARANCEUR CLEAR 04/11/2016 1600   LABSPEC 1.010 09/07/2016 1116   PHURINE 6.0 09/07/2016 1116   PHURINE 5.0 04/11/2016 1600   GLUCOSEU Negative 09/07/2016 1116   HGBUR Small 09/07/2016 1116   HGBUR MODERATE (A) 04/11/2016 1600   BILIRUBINUR Negative 09/07/2016 1116   KETONESUR Negative 09/07/2016 1116   KETONESUR NEGATIVE 04/11/2016 1600   PROTEINUR Negative 09/07/2016 1116   PROTEINUR NEGATIVE 04/11/2016 1600   UROBILINOGEN 0.2 09/07/2016 1116   NITRITE Negative 09/07/2016 1116   NITRITE NEGATIVE 04/11/2016 1600   LEUKOCYTESUR Negative 09/07/2016 1116    Lab Results  Component Value Date   BACTERIA Trace 09/07/2016    Pertinent Imaging:  Results for orders placed during the hospital encounter of 10/02/18  DG Abd 1 View    Narrative CLINICAL DATA:  Follow-up kidney stones.  EXAM: ABDOMEN - 1 VIEW  COMPARISON:  09/20/2017  FINDINGS: Bowel gas pattern is nonobstructive. No calcifications projecting over the kidneys. Left-sided phleboliths unchanged. Previously noted 6.5 mm calcification over the right pelvis no longer visualized. Radiation seed implants are present. Remainder of the exam is unchanged.  IMPRESSION: Nonobstructive bowel gas pattern.  Previously noted 6.5 mm calcification over the right pelvis no longer visualized. No evidence of nephrolithiasis.   Electronically Signed   By: Marin Olp M.D.   On: 10/02/2018 15:54    No results found for this or any previous visit. No results found for this or any previous visit. No results found for this or any previous visit. Results for orders placed during the hospital encounter of 09/18/17  US RENAL   Narrative CLINICAL DATA:  Follow-up examination for right-sided ureteral calculus.  EXAM: RENAL / URINARY TRACT ULTRASOUND COMPLETE  COMPARISON:  Prior CT from 05/23/2016.  FINDINGS: Right Kidney:  Length: 15.6 cm. Echogenicity within normal limits. No hydronephrosis. 1.0 x 0.9 x 1.0 cm minimally complex cyst present within the interpolar right kidney grossly similar to previous. No shadowing echogenic calculi identified.  Left Kidney:  Length: 13.9 cm. Echogenicity within normal limits. No mass or hydronephrosis visualized. No shadowing echogenic calculi identified.  Bladder:  Appears normal for degree of bladder distention. Bilateral ureteral jets identified.  IMPRESSION: 1. No hydronephrosis.  No sonographic evidence for nephrolithiasis. 2. 1 cm minimally complex right renal cyst. Finding is almost certainly benign, and grossly similar as compared to prior CT from 2018.   Electronically Signed   By: Jeannine Boga M.D.   On: 09/18/2017 17:45    No results found for this or any previous visit. No results  found for this or any previous visit. No results found for this or any previous visit.  Assessment & Plan:    1. Prostate cancer ([place]) -PSA today, will call with results -If PSA is stable, RTC 6 months with PSA  2. Benign prostatic hyperplasia with urinary obstruction Continue flomax 0.52m daily  3. Weak urinary stream -continue flomax 0.472mdaily  RTC 6 months with PSA and renal [place]  No follow-ups on file.  Nicolette Bang, MD  [facility] [place]

## 2019-05-14 ENCOUNTER — Other Ambulatory Visit: Payer: Self-pay | Admitting: Urology

## 2019-05-15 NOTE — Progress Notes (Signed)
Lab results mailed to pt.

## 2019-09-14 ENCOUNTER — Other Ambulatory Visit: Payer: Self-pay | Admitting: Urology

## 2019-10-21 ENCOUNTER — Ambulatory Visit (INDEPENDENT_AMBULATORY_CARE_PROVIDER_SITE_OTHER): Payer: Commercial Managed Care - PPO | Admitting: Urology

## 2019-10-21 ENCOUNTER — Other Ambulatory Visit: Payer: Self-pay

## 2019-10-21 ENCOUNTER — Encounter: Payer: Self-pay | Admitting: Urology

## 2019-10-21 VITALS — BP 127/71 | HR 69 | Temp 97.5°F | Ht 73.0 in | Wt 208.0 lb

## 2019-10-21 DIAGNOSIS — N200 Calculus of kidney: Secondary | ICD-10-CM | POA: Diagnosis not present

## 2019-10-21 DIAGNOSIS — R3912 Poor urinary stream: Secondary | ICD-10-CM | POA: Diagnosis not present

## 2019-10-21 DIAGNOSIS — C61 Malignant neoplasm of prostate: Secondary | ICD-10-CM | POA: Diagnosis not present

## 2019-10-21 DIAGNOSIS — N401 Enlarged prostate with lower urinary tract symptoms: Secondary | ICD-10-CM

## 2019-10-21 DIAGNOSIS — N138 Other obstructive and reflux uropathy: Secondary | ICD-10-CM

## 2019-10-21 LAB — URINALYSIS, ROUTINE W REFLEX MICROSCOPIC
Bilirubin, UA: NEGATIVE
Glucose, UA: NEGATIVE
Leukocytes,UA: NEGATIVE
Nitrite, UA: NEGATIVE
Specific Gravity, UA: 1.025 (ref 1.005–1.030)
Urobilinogen, Ur: 1 mg/dL (ref 0.2–1.0)
pH, UA: 7 (ref 5.0–7.5)

## 2019-10-21 LAB — MICROSCOPIC EXAMINATION
Bacteria, UA: NONE SEEN
Epithelial Cells (non renal): NONE SEEN /hpf (ref 0–10)
Renal Epithel, UA: NONE SEEN /hpf
WBC, UA: NONE SEEN /hpf (ref 0–5)

## 2019-10-21 MED ORDER — TAMSULOSIN HCL 0.4 MG PO CAPS: 0.4000 mg | ORAL_CAPSULE | Freq: Every day | ORAL | 3 refills | Status: DC

## 2019-10-21 NOTE — Progress Notes (Signed)
10/21/2019 3:34 PM   Luis Griffin 07-14-1955 [id number]  Referring provider: Curlene Labrum, MD [place],  [place] [address]  Prostate cancer, Weak urinary stream  HPI: Mr Luis Griffin is a 64yo here for followup for prostate cancer, weak urinary stream, and nephrolithiasis. No stone events since last visit. He has intermittent right flank pain. No recent PSA. Last PSA 6 months was 0.2. He has a good stream on flomax 0.4mg     PMH: Past Medical History:  Diagnosis Date  . Coronary artery disease    cardiologsit-  dr Dellis Filbert clevenger  . Depression   . Frequency of urination   . History of MI (myocardial infarction)   . Hyperlipidemia   . Hypertension   . OA (osteoarthritis)   . Productive cough   . Prostate cancer ([place]) UROLOGIST-  DR Brezlyn Manrique/  ONCOLOGIST-  DR MANNING   dx 02/ 2018,  Stage T1c,  PSA 5.0,  Gleason 4+3, vol 45cc  . S/P drug eluting coronary stent placement   . Smokers' cough ([place])   . Systemic lupus erythematosus ([place])   . Urgency of urination   . Wears hearing aid in both ears     Surgical History: Past Surgical History:  Procedure Laterality Date  . CARDIOVASCULAR STRESS TEST  06-03-2007   dr clevenger ([facility])   moderate sized fixed defect in the inferolateral wall probable scar (poss. attenuation artifact),  small area peri-infarct ischemia in the inferolateral region,  segment of inferolateral is akinetic , septal motion consistant w/ post-op changes,  ef 65%,  no evidence significant ischemia  . CORONARY ANGIOPLASTY WITH STENT PLACEMENT  2007  at [facility]   x2 DES per pt  . CORONARY ARTERY BYPASS GRAFT  2006   [place]   x4 per pt  . DOBUTAMINE STRESS ECHO  09/14/2015   dr j. cleveger ([facility])   normal stress echo-- moderate LVH, ef 55-60%, mild inferior wall hypokinesis,  mild AV sclerosis without stenosis,    . PROSTATE BIOPSY  05/02/2016  . RADIOACTIVE SEED IMPLANT N/A 08/17/2016   Procedure: RADIOACTIVE SEED  IMPLANT/BRACHYTHERAPY IMPLANT;  Surgeon: Cleon Gustin, MD;  [facility];  Service: Urology;  Laterality: N/A;  . TONSILLECTOMY  child  . WRIST GANGLION EXCISION Right 2015    Home Medications:  Allergies as of 10/21/2019      Reactions   Varenicline Other (See Comments)   Other reaction(s): Constipation Other reaction(s): Constipation      Medication List       Accurate as of October 21, 2019  3:34 PM. If you have any questions, ask your nurse or doctor.        STOP taking these medications   HYDROcodone-acetaminophen 5-325 MG tablet Commonly known as: Norco Stopped by: Nicolette Bang, MD     TAKE these medications   albuterol 108 (90 Base) MCG/ACT inhaler Commonly known as: VENTOLIN HFA INHALE 1 2 PUFFS EVERY 4 6 HOURS AS NEEDED FOR SHORTNESS OF BREATH OR WHEEZING   aspirin 325 MG tablet Take 325 mg by mouth daily.   aspirin 325 MG EC tablet Take by mouth.   atenolol 25 MG tablet Commonly known as: TENORMIN Take 25 mg by mouth 2 (two) times daily.   atorvastatin 10 MG tablet Commonly known as: LIPITOR Take 10 mg by mouth every evening.   atorvastatin 40 MG tablet Commonly known as: LIPITOR Take 40 mg by mouth daily.   buPROPion 150 MG  12 hr tablet Commonly known as: WELLBUTRIN SR Take 150 mg by mouth 2 (two) times daily.   clopidogrel 75 MG tablet Commonly known as: PLAVIX   diclofenac Sodium 1 % Gel Commonly known as: VOLTAREN Place onto the skin.   ezetimibe 10 MG tablet Commonly known as: ZETIA Take 10 mg by mouth every evening.   lisinopril 10 MG tablet Commonly known as: ZESTRIL Take 10 mg by mouth every evening.   nitroGLYCERIN 0.4 MG SL tablet Commonly known as: NITROSTAT Place under the tongue.   tamsulosin 0.4 MG Caps capsule Commonly known as: FLOMAX Take 1 capsule (0.4 mg total) by mouth daily after supper.   tamsulosin 0.4 MG Caps capsule Commonly known as: FLOMAX Take 1 capsule (0.4 mg total) by  mouth 2 (two) times daily.   tamsulosin 0.4 MG Caps capsule Commonly known as: FLOMAX TAKE 1 CAPSULE BY MOUTH TWICE A DAY       Allergies:  Allergies  Allergen Reactions  . Varenicline Other (See Comments)    Other reaction(s): Constipation Other reaction(s): Constipation    Family History: Family History  Problem Relation Age of Onset  . Cancer Father        prostate ca  . Kidney cancer Father     Social History:  reports that he has been smoking cigarettes. He has a 45.00 pack-year smoking history. His smokeless tobacco use includes snuff and chew. He reports current alcohol use. He reports that he does not use drugs.  ROS: All other review of systems were reviewed and are negative except what is noted above in HPI  Physical Exam: BP 127/71   Pulse 69   Temp (!) 97.5 F (36.4 C)   Ht 6\' 1"  (1.854 m)   Wt 208 lb (94.3 kg)   BMI 27.44 kg/m   Constitutional:  Alert and oriented, No acute distress. HEENT: [place] AT, moist mucus membranes.  Trachea midline, no masses. Cardiovascular: No clubbing, cyanosis, or edema. Respiratory: Normal respiratory effort, no increased work of breathing. GI: Abdomen is soft, nontender, nondistended, no abdominal masses GU: No CVA tenderness.  Lymph: No cervical or inguinal lymphadenopathy. Skin: No rashes, bruises or suspicious lesions. Neurologic: Grossly intact, no focal deficits, moving all 4 extremities. Psychiatric: Normal mood and affect.  Laboratory Data: Lab Results  Component Value Date   WBC 9.6 08/10/2016   HGB 18.1 (H) 08/10/2016   HCT 51.2 08/10/2016   MCV 88.3 08/10/2016   PLT 219 08/10/2016    Lab Results  Component Value Date   CREATININE 0.87 08/10/2016    No results found for: PSA  No results found for: TESTOSTERONE  No results found for: HGBA1C  Urinalysis    Component Value Date/Time   COLORURINE YELLOW 04/11/2016 1600   APPEARANCEUR Clear 10/21/2019 1525   LABSPEC 1.010 09/07/2016 1116    PHURINE 6.0 09/07/2016 1116   PHURINE 5.0 04/11/2016 1600   GLUCOSEU Negative 10/21/2019 1525   GLUCOSEU Negative 09/07/2016 1116   HGBUR Small 09/07/2016 1116   HGBUR MODERATE (A) 04/11/2016 1600   BILIRUBINUR Negative 10/21/2019 1525   BILIRUBINUR Negative 09/07/2016 1116   KETONESUR Negative 09/07/2016 1116   KETONESUR NEGATIVE 04/11/2016 1600   PROTEINUR Trace (A) 10/21/2019 1525   PROTEINUR Negative 09/07/2016 1116   PROTEINUR NEGATIVE 04/11/2016 1600   UROBILINOGEN 0.2 09/07/2016 1116   NITRITE Negative 10/21/2019 1525   NITRITE Negative 09/07/2016 1116   NITRITE NEGATIVE 04/11/2016 1600   LEUKOCYTESUR Negative 10/21/2019 1525   LEUKOCYTESUR Negative 09/07/2016  1116    Lab Results  Component Value Date   LABMICR See below: 10/21/2019   WBCUA None seen 10/21/2019   LABEPIT None seen 10/21/2019   BACTERIA None seen 10/21/2019    Pertinent Imaging:  Results for orders placed during the hospital encounter of 10/02/18  DG Abd 1 View  Narrative CLINICAL DATA:  Follow-up kidney stones.  EXAM: ABDOMEN - 1 VIEW  COMPARISON:  09/20/2017  FINDINGS: Bowel gas pattern is nonobstructive. No calcifications projecting over the kidneys. Left-sided phleboliths unchanged. Previously noted 6.5 mm calcification over the right pelvis no longer visualized. Radiation seed implants are present. Remainder of the exam is unchanged.  IMPRESSION: Nonobstructive bowel gas pattern.  Previously noted 6.5 mm calcification over the right pelvis no longer visualized. No evidence of nephrolithiasis.   Electronically Signed By: Marin Olp M.D. On: 10/02/2018 15:54  No results found for this or any previous visit.  No results found for this or any previous visit.  No results found for this or any previous visit.  Results for orders placed during the hospital encounter of 09/18/17  US RENAL  Narrative CLINICAL DATA:  Follow-up examination for right-sided  ureteral calculus.  EXAM: RENAL / URINARY TRACT ULTRASOUND COMPLETE  COMPARISON:  Prior CT from 05/23/2016.  FINDINGS: Right Kidney:  Length: 15.6 cm. Echogenicity within normal limits. No hydronephrosis. 1.0 x 0.9 x 1.0 cm minimally complex cyst present within the interpolar right kidney grossly similar to previous. No shadowing echogenic calculi identified.  Left Kidney:  Length: 13.9 cm. Echogenicity within normal limits. No mass or hydronephrosis visualized. No shadowing echogenic calculi identified.  Bladder:  Appears normal for degree of bladder distention. Bilateral ureteral jets identified.  IMPRESSION: 1. No hydronephrosis.  No sonographic evidence for nephrolithiasis. 2. 1 cm minimally complex right renal cyst. Finding is almost certainly benign, and grossly similar as compared to prior CT from 2018.   Electronically Signed By: Jeannine Boga M.D. On: 09/18/2017 17:45  No results found for this or any previous visit.  No results found for this or any previous visit.  No results found for this or any previous visit.   Assessment & Plan:    1. Nephrolithiasis KUB - Urinalysis, Routine w reflex microscopic  2. Prostate cancer ([place]) PSA today. RTC 6 months with PSA  3. Benign prostatic hyperplasia with urinary obstruction Continue flomax 0.4mg  daily  4. Weak urinary stream -continue flomax 0.4mg  daily   No follow-ups on file.  Nicolette Bang, MD  [facility] [place]

## 2019-10-21 NOTE — Progress Notes (Signed)
Urological Symptom Review  Patient is experiencing the following symptoms: Leakage of urine Blood in urine   Review of Systems  Gastrointestinal (upper)  : Negative for upper GI symptoms  Gastrointestinal (lower) : Negative for lower GI symptoms  Constitutional : Negative for symptoms  Skin: Negative for skin symptoms  Eyes: Negative for eye symptoms  Ear/Nose/Throat : Sinus problems  Hematologic/Lymphatic: Easy bruising  Cardiovascular : Negative for cardiovascular symptoms  Respiratory : Negative for respiratory symptoms  Endocrine: Negative for endocrine symptoms  Musculoskeletal: Joint pain  Neurological: Negative for neurological symptoms  Psychologic: Negative for psychiatric symptoms

## 2019-10-21 NOTE — Patient Instructions (Signed)
Prostate Cancer  The prostate is a male gland that helps make semen. Prostate cancer is when abnormal cells grow in this gland. Follow these instructions at home:  Take over-the-counter and prescription medicines only as told by your doctor.  Eat a healthy diet.  Get plenty of sleep.  Ask your doctor for help to find a support group for men with prostate cancer.  Keep all follow-up visits as told by your doctor. This is important.  If you have to go to the hospital, let your cancer doctor (oncologist) know.  Touch, hold, hug, and caress your partner to continue to show sexual feelings. Contact a doctor if:  You have trouble peeing (urinating).  You have blood in your pee (urine).  You have pain in your hips, back, or chest. Get help right away if:  You have weakness in your legs.  You lose feeling (have numbness) in your legs.  You cannot control your pee or your poop (stool).  You have trouble breathing.  You have sudden pain in your chest.  You have chills or a fever. Summary  The prostate is a male gland that helps make semen. Prostate cancer is when abnormal cells grow in this gland.  Ask your doctor for help to find a support group for men with prostate cancer.  Contact a doctor if you have problems peeing or have any new pain that you did not have before. This information is not intended to replace advice given to you by your health care provider. Make sure you discuss any questions you have with your health care provider. Document Revised: 02/15/2017 Document Reviewed: 11/14/2015 Elsevier Patient Education  2020 Elsevier Inc.  

## 2019-10-22 ENCOUNTER — Ambulatory Visit (HOSPITAL_COMMUNITY)
Admission: RE | Admit: 2019-10-22 | Discharge: 2019-10-22 | Disposition: A | Discharge status: Home / Self Care | Payer: Commercial Managed Care - PPO | Source: Ambulatory Visit | Attending: Urology | Admitting: Urology

## 2019-10-22 DIAGNOSIS — N200 Calculus of kidney: Secondary | ICD-10-CM | POA: Diagnosis present

## 2019-10-22 LAB — PSA: Prostate Specific Ag, Serum: 0.2 ng/mL (ref 0.0–4.0)

## 2019-10-23 ENCOUNTER — Telehealth: Payer: Self-pay

## 2019-10-26 ENCOUNTER — Encounter: Payer: Self-pay | Admitting: Urology

## 2019-10-26 ENCOUNTER — Other Ambulatory Visit: Payer: Self-pay

## 2019-10-26 DIAGNOSIS — N200 Calculus of kidney: Secondary | ICD-10-CM

## 2019-10-27 ENCOUNTER — Telehealth: Payer: Self-pay

## 2019-10-27 NOTE — Telephone Encounter (Signed)
-----   Message from Cleon Gustin, MD sent at 10/27/2019  8:41 AM EDT ----- PSA normal ----- Message ----- From: Royanne Foots, CMA Sent: 10/22/2019  10:55 AM EDT To: Cleon Gustin, MD   ----- Message ----- From: Interface, [facility] Lab Results In Sent: 10/21/2019   3:34 PM EDT To: [facility] [place]

## 2019-10-27 NOTE — Telephone Encounter (Signed)
mychart notification sent 

## 2019-10-29 ENCOUNTER — Encounter: Payer: Self-pay | Admitting: Urology

## 2019-11-09 NOTE — Telephone Encounter (Signed)
error 

## 2020-03-01 ENCOUNTER — Encounter: Payer: Self-pay | Admitting: Internal Medicine

## 2020-03-01 ENCOUNTER — Ambulatory Visit: Payer: Self-pay

## 2020-03-01 ENCOUNTER — Other Ambulatory Visit: Payer: Self-pay

## 2020-03-01 ENCOUNTER — Ambulatory Visit: Payer: Commercial Managed Care - PPO | Admitting: Internal Medicine

## 2020-03-01 VITALS — BP 127/80 | HR 64 | Ht 71.0 in | Wt 214.6 lb

## 2020-03-01 DIAGNOSIS — M65351 Trigger finger, right little finger: Secondary | ICD-10-CM | POA: Insufficient documentation

## 2020-03-01 DIAGNOSIS — R200 Anesthesia of skin: Secondary | ICD-10-CM | POA: Insufficient documentation

## 2020-03-01 DIAGNOSIS — R202 Paresthesia of skin: Secondary | ICD-10-CM

## 2020-03-01 DIAGNOSIS — R21 Rash and other nonspecific skin eruption: Secondary | ICD-10-CM | POA: Insufficient documentation

## 2020-03-01 DIAGNOSIS — M65341 Trigger finger, right ring finger: Secondary | ICD-10-CM | POA: Insufficient documentation

## 2020-03-01 DIAGNOSIS — M2550 Pain in unspecified joint: Secondary | ICD-10-CM | POA: Diagnosis not present

## 2020-03-01 NOTE — Progress Notes (Signed)
Office Visit Note  Patient: Luis Griffin             Date of Birth: December 05, 1955           MRN: [id number]             PCP: Curlene Labrum, MD Referring: Curlene Labrum, MD Visit Date: 03/01/2020 Occupation: Forensic scientist  Subjective:  Pain of the Right Elbow, Pain of the Left Elbow, Pain of the Right Hand, and New Patient (Initial Visit)   History of Present Illness: Luis Griffin is a 64 y.o. male with history of prostate cancer and CAD here for evaluation of positive ANA and arthralgias. He has a long history of generalized joint pains but in the past couple months his arms are having a lot of pain especially in the right hand and the elbows. He has triggering of the right small finger that is increasing. He also notices numbness of fingers in the right hand that worsens when holding a steering for prolonged duration. He has some bony enlargement of the hadns but does not notice much soft tissue swelling or redness.  Activities of Daily Living:  Patient reports morning stiffness for 24 hours.   Patient Reports nocturnal pain.  Difficulty dressing/grooming: Reports Difficulty climbing stairs: Denies Difficulty getting out of chair: Denies Difficulty using hands for taps, buttons, cutlery, and/or writing: Reports  Review of Systems  Constitutional: Positive for fatigue.  HENT: Negative for mouth sores, mouth dryness and nose dryness.   Eyes: Positive for visual disturbance. Negative for pain, itching and dryness.  Respiratory: Positive for cough. Negative for hemoptysis, shortness of breath and difficulty breathing.   Cardiovascular: Negative for chest pain, palpitations and swelling in legs/feet.  Gastrointestinal: Positive for constipation. Negative for abdominal pain, blood in stool and diarrhea.  Endocrine: Negative for increased urination.  Genitourinary: Negative for painful urination.  Musculoskeletal: Positive for arthralgias, joint pain, joint swelling  and morning stiffness. Negative for myalgias, muscle weakness, muscle tenderness and myalgias.  Skin: Positive for rash and redness. Negative for color change.  Allergic/Immunologic: Negative for susceptible to infections.  Neurological: Positive for numbness. Negative for dizziness, headaches, memory loss and weakness.  Hematological: Negative for swollen glands.  Psychiatric/Behavioral: Negative for confusion and sleep disturbance.    PMFS History:  Patient Active Problem List   Diagnosis Date Noted  . Numbness and tingling in right hand 03/01/2020  . Trigger finger, right little finger 03/01/2020  . Polyarthralgia 03/01/2020  . Rash and other nonspecific skin eruption 03/01/2020  . Weak urinary stream 04/22/2019  . Benign prostatic hyperplasia with urinary obstruction 04/22/2019  . Dysuria 09/07/2016  . Prostate cancer ([place]) 05/30/2016    Past Medical History:  Diagnosis Date  . Coronary artery disease    cardiologsit-  dr Dellis Filbert clevenger  . Depression   . Frequency of urination   . History of MI (myocardial infarction)   . Hyperlipidemia   . Hypertension   . OA (osteoarthritis)   . Productive cough   . Prostate cancer ([place]) UROLOGIST-  DR MCKENZIE/  ONCOLOGIST-  DR MANNING   dx 02/ 2018,  Stage T1c,  PSA 5.0,  Gleason 4+3, vol 45cc  . S/P drug eluting coronary stent placement   . Smokers' cough ([place])   . Systemic lupus erythematosus ([place])   . Urgency of urination   . Wears hearing aid in both ears     Family History  Problem Relation Age of Onset  .  Cancer Father        prostate ca  . Kidney cancer Father   . Heart disease Mother   . Lupus Mother   . Thyroid disease Mother   . Rheum arthritis Maternal Aunt   . Lupus Maternal Grandfather    Past Surgical History:  Procedure Laterality Date  . CARDIOVASCULAR STRESS TEST  06-03-2007   dr clevenger ([facility])   moderate sized fixed defect in the inferolateral wall probable scar (poss. attenuation artifact),  small  area peri-infarct ischemia in the inferolateral region,  segment of inferolateral is akinetic , septal motion consistant w/ post-op changes,  ef 65%,  no evidence significant ischemia  . CORONARY ANGIOPLASTY WITH STENT PLACEMENT  2007  at [facility]   x2 DES per pt  . CORONARY ARTERY BYPASS GRAFT  2006   [place]   x4 per pt  . DOBUTAMINE STRESS ECHO  09/14/2015   dr j. cleveger ([facility])   normal stress echo-- moderate LVH, ef 55-60%, mild inferior wall hypokinesis,  mild AV sclerosis without stenosis,    . PROSTATE BIOPSY  05/02/2016  . RADIOACTIVE SEED IMPLANT N/A 08/17/2016   Procedure: RADIOACTIVE SEED IMPLANT/BRACHYTHERAPY IMPLANT;  Surgeon: Cleon Gustin, MD;  Location: [facility];  Service: Urology;  Laterality: N/A;  . TONSILLECTOMY  child  . WRIST GANGLION EXCISION Right 2015   Social History   Social History Narrative  . Not on file   Immunization History  Administered Date(s) Administered  . Moderna Sars-Covid-2 Vaccination 05/18/2019, 06/18/2019, 02/17/2020     Objective: Vital Signs: BP 127/80 (BP Location: Left Arm, Patient Position: Sitting, Cuff Size: Normal)   Pulse 64   Ht 5\' 11"  (1.803 m)   Wt 214 lb 9.6 oz (97.3 kg)   BMI 29.93 kg/m    Physical Exam HENT:     Right Ear: External ear normal.     Left Ear: External ear normal.     Mouth/Throat:     Mouth: Mucous membranes are moist.     Pharynx: Oropharynx is clear.  Eyes:     Conjunctiva/sclera: Conjunctivae normal.  Cardiovascular:     Rate and Rhythm: Normal rate and regular rhythm.  Pulmonary:     Effort: Pulmonary effort is normal.     Breath sounds: Normal breath sounds.  Skin:    General: Skin is warm and dry.  Neurological:     General: No focal deficit present.     Mental Status: He is alert.     Comments: Hard of hearing      Musculoskeletal Exam:  Neck full range of motion no tenderness Shoulder, elbow full ROM Positive tinels and phalens  test on right wrist, also describes shock sensation with percussion over cubital tunnel, grip strength is intact, heberdon and bouchard nodes of hands b/l worse on right, palpable tender nodule over 5th digit flexor tendon proximal to MCP joint Normal hip internal and external rotation without pain, no tenderness to lateral hip palpation Knees, ankles full range of motion no swelling   Investigation: No additional findings.  Imaging: No results found.  Recent Labs: Lab Results  Component Value Date   WBC 9.6 08/10/2016   HGB 18.1 (H) 08/10/2016   PLT 219 08/10/2016   NA 140 08/10/2016   K 4.2 08/10/2016   CL 110 08/10/2016   CO2 22 08/10/2016   GLUCOSE 96 08/10/2016   BUN 17 08/10/2016   CREATININE 0.87 08/10/2016   BILITOT 0.7 08/10/2016  ALKPHOS 84 08/10/2016   AST 20 08/10/2016   ALT 26 08/10/2016   PROT 7.1 08/10/2016   ALBUMIN 4.3 08/10/2016   CALCIUM 9.1 08/10/2016   GFRAA >60 08/10/2016    Speciality Comments: No specialty comments available.  Procedures:  Hand/UE Inj: R small A1 for trigger finger on 03/01/2020 9:45 AM Indications: pain and tendon swelling Details: 25 G needle, volar approach Medications: 20 mg triamcinolone acetonide 40 MG/ML; 0.5 mL lidocaine 1 % Procedure, treatment alternatives, risks and benefits explained, specific risks discussed. Consent was given by the patient. Immediately prior to procedure a time out was called to verify the correct patient, procedure, equipment, support staff and site/side marked as required.     Allergies: Varenicline   Assessment / Plan:     Visit Diagnoses: Trigger finger, right little finger - Plan: Hand/UE Inj: R small A1  Right 5th digit trigger finger getting worse and with pain injected with 20mg  triamcinolone in office today callback instructions provided advised if he does not see benefit next would be follow up with hand specialist.  Numbness and tingling in right hand - Plan: Ambulatory referral to  Physical Medicine Rehab  He has OA of both hands but current symptoms sound like carpal tunnel syndrome. Referral for evaluation of this would benefit with nerve conduction study to confirm and if so treat. No motor loss so not urgent.  Polyarthralgia Rash and other nonspecific skin eruption - Plan: ANA, Sedimentation rate, C-reactive protein  No evidence of active systemic connective tissue disease. Will obtain ANA by IFA and also check inflammatory labs. His lower extremities sounds like dependent edema from prolonged sitting and driving do not suspect major vasculitis issue.  Orders: Orders Placed This Encounter  Procedures  . Hand/UE Inj: R small A1  . ANA  . Sedimentation rate  . C-reactive protein  . Ambulatory referral to Physical Medicine Rehab   No orders of the defined types were placed in this encounter.    Follow-Up Instructions: Return if symptoms worsen or fail to improve.   Collier Salina, MD  Note - This record has been created using [employer].  Chart creation errors have been sought, but may not always  have been located. Such creation errors do not reflect on  the standard of medical care.

## 2020-03-02 LAB — SEDIMENTATION RATE: Sed Rate: 2 mm/h (ref 0–20)

## 2020-03-02 LAB — C-REACTIVE PROTEIN: CRP: 2.3 mg/L (ref <8.0)

## 2020-03-02 LAB — ANA: Anti Nuclear Antibody (ANA): NEGATIVE

## 2020-03-03 NOTE — Progress Notes (Signed)
Inflammation markers and autoimmune disease antibody tests are normal. I suspect the hand pain and numbness problems are related to nerve impingement so the test for this would be the conduction test.

## 2020-03-04 MED ORDER — TRIAMCINOLONE ACETONIDE 40 MG/ML IJ SUSP
20.0000 mg | INTRAMUSCULAR | Status: AC | PRN
  Administered 2020-03-01: 20 mg

## 2020-03-04 MED ORDER — LIDOCAINE HCL 1 % IJ SOLN
0.5000 mL | INTRAMUSCULAR | Status: AC | PRN
  Administered 2020-03-01: .5 mL

## 2020-04-01 ENCOUNTER — Ambulatory Visit: Payer: Commercial Managed Care - PPO | Admitting: Physical Medicine and Rehabilitation

## 2020-04-01 ENCOUNTER — Other Ambulatory Visit: Payer: Self-pay

## 2020-04-01 ENCOUNTER — Encounter: Payer: Self-pay | Admitting: Physical Medicine and Rehabilitation

## 2020-04-01 DIAGNOSIS — R202 Paresthesia of skin: Secondary | ICD-10-CM

## 2020-04-01 NOTE — Progress Notes (Signed)
Numbness in right hand- sometimes in both. Right hand dominant. Elbow pain.  No lotion per patient Numeric Pain Rating Scale and Functional Assessment Average Pain 8   In the last MONTH (on 0-10 scale) has pain interfered with the following?  1. General activity like being  able to carry out your everyday physical activities such as walking, climbing stairs, carrying groceries, or moving a chair?  Rating(7)

## 2020-04-01 NOTE — Progress Notes (Signed)
Luis Griffin - 65 y.o. male MRN [id number]  Date of birth: 03-16-1956  Office Visit Note: Visit Date: 04/01/2020 PCP: Curlene Labrum, MD Referred by: Curlene Labrum, MD  Subjective: Chief Complaint  Patient presents with  . Right Hand - Numbness  . Right Elbow - Pain   HPI:  Luis Griffin is a 65 y.o. male who comes in today at the request of Dr. Vernelle Emerald for electrodiagnostic study of the Right upper extremities.  Patient is Right hand dominant.  He describes pain numbness and tingling in the right hand and somewhat more radial digits to middle and somewhat global.  Occasional symptoms in the left hand but predominantly right hand.  Get some referral into the elbow.  Does endorse some neck pain with at times pain in the arm.  He reports a remote history of having a problem at C6 that got better with physical therapy.  He has had no prior cervical surgery.  No imaging on record at least here on epic.  Remote cervical x-ray done in 2006.  He has had no prior electrodiagnostic studies.  He was having some triggering and this was injected by Dr. Benjamine Mola and he reports significant improvement of the trigger finger.  His case is complicated by multiple medical issues and positive ANA.  He has had prior wrist surgery with excision of a ganglion cyst.   ROS Otherwise per HPI.  Assessment & Plan: Visit Diagnoses:    ICD-10-CM   1. Paresthesia of skin  R20.2 NCV with EMG (electromyography)    Plan: Impression: The above electrodiagnostic study is ABNORMAL and reveals evidence of mild chronic C6 radiculopathy on the right.    There is no significant electrodiagnostic evidence of any other focal nerve entrapment, brachial plexopathy or generalized peripheral neuropathy.   Recommendations: 1.  Follow-up with referring physician. 2.  Continue current management of symptoms.  Meds & Orders: No orders of the defined types were placed in this encounter.   Orders Placed This  Encounter  Procedures  . NCV with EMG (electromyography)    Follow-up: Return for Vernelle Emerald, MD.   Procedures: No procedures performed  Impression: The above electrodiagnostic study is ABNORMAL and reveals evidence of mild chronic C6 radiculopathy on the right.    There is no significant electrodiagnostic evidence of any other focal nerve entrapment, brachial plexopathy or generalized peripheral neuropathy.   Recommendations: 1.  Follow-up with referring physician. 2.  Continue current management of symptoms.  ___________________________ Luis Griffin FAAPMR Board Certified, American Board of Physical Medicine and Rehabilitation    Nerve Conduction Studies Anti Sensory Summary Table   Stim Site NR Peak (ms) Norm Peak (ms) P-T Amp (V) Norm P-T Amp Site1 Site2 Delta-P (ms) Dist (cm) Vel (m/s) Norm Vel (m/s)  Right Median Acr Palm Anti Sensory (2nd Digit)  30.4C  Wrist    3.4 <3.6 17.4 >10 Wrist Palm 1.7 0.0    Palm    1.7 <2.0 14.8         Right Radial Anti Sensory (Base 1st Digit)  31.2C  Wrist    1.9 <3.1 27.6  Wrist Base 1st Digit 1.9 0.0    Right Ulnar Anti Sensory (5th Digit)  31.1C  Wrist    3.2 <3.7 17.8 >15.0 Wrist 5th Digit 3.2 14.0 44 >38   Motor Summary Table   Stim Site NR Onset (ms) Norm Onset (ms) O-P Amp (mV) Norm O-P Amp Site1 Site2 Delta-0 (ms) Dist (cm) Vel (  m/s) Norm Vel (m/s)  Right Median Motor (Abd Poll Brev)  31.5C  Wrist    3.8 <4.2 *4.6 >5 Elbow Wrist 4.0 22.0 55 >50  Elbow    7.8  4.5         Right Ulnar Motor (Abd Dig Min)  31.7C  Wrist    2.9 <4.2 7.8 >3 B Elbow Wrist 3.7 21.5 58 >53  B Elbow    6.6  7.4  A Elbow B Elbow 1.5 10.5 70 >53  A Elbow    8.1  7.2          EMG   Side Muscle Nerve Root Ins Act Fibs Psw Amp Dur Poly Recrt Int Fraser Din Comment  Right Abd Poll Brev Median C8-T1 Nml Nml Nml Nml Nml 0 Nml Nml   Right 1stDorInt Ulnar C8-T1 Nml Nml Nml Nml Nml 0 Nml Nml   Right PronatorTeres Median C6-7 *Incr Nml Nml Nml Nml 0 Nml  Nml   Right Biceps Musculocut C5-6 *Incr *1+ *1+ Nml Nml 0 Nml Nml   Right Deltoid Axillary C5-6 Nml Nml Nml Nml Nml 0 Nml Nml   Right Ext Digitorum  Radial (Post Int) C7-8 *Incr Nml Nml Nml Nml 0 Nml Nml     Nerve Conduction Studies Anti Sensory Left/Right Comparison   Stim Site L Lat (ms) R Lat (ms) L-R Lat (ms) L Amp (V) R Amp (V) L-R Amp (%) Site1 Site2 L Vel (m/s) R Vel (m/s) L-R Vel (m/s)  Median Acr Palm Anti Sensory (2nd Digit)  30.4C  Wrist  3.4   17.4  Wrist Palm     Palm  1.7   14.8        Radial Anti Sensory (Base 1st Digit)  31.2C  Wrist  1.9   27.6  Wrist Base 1st Digit     Ulnar Anti Sensory (5th Digit)  31.1C  Wrist  3.2   17.8  Wrist 5th Digit  44    Motor Left/Right Comparison   Stim Site L Lat (ms) R Lat (ms) L-R Lat (ms) L Amp (mV) R Amp (mV) L-R Amp (%) Site1 Site2 L Vel (m/s) R Vel (m/s) L-R Vel (m/s)  Median Motor (Abd Poll Brev)  31.5C  Wrist  3.8   *4.6  Elbow Wrist  55   Elbow  7.8   4.5        Ulnar Motor (Abd Dig Min)  31.7C  Wrist  2.9   7.8  B Elbow Wrist  58   B Elbow  6.6   7.4  A Elbow B Elbow  70   A Elbow  8.1   7.2           Waveforms:             Clinical History: No specialty comments available.     Objective:  VS:  HT:    WT:   BMI:     BP:   HR: bpm  TEMP: ( )  RESP:  Physical Exam Musculoskeletal:        General: No tenderness.     Comments: Inspection reveals osteoarthritic changes but no atrophy of the bilateral APB or FDI or hand intrinsics. There is no swelling, color changes, allodynia or dystrophic changes.  No synovitis noted.  There is 5 out of 5 strength in the bilateral wrist extension, finger abduction and long finger flexion. There is intact sensation to light touch in all dermatomal and peripheral nerve distributions. . There  is a negative Hoffmann's test bilaterally.  Skin:    General: Skin is warm and dry.     Findings: No erythema or rash.  Neurological:     General: No focal deficit present.      Mental Status: He is alert and oriented to person, place, and time.     Sensory: No sensory deficit.     Motor: No weakness or abnormal muscle tone.     Coordination: Coordination normal.     Gait: Gait normal.  Psychiatric:        Mood and Affect: Mood normal.        Behavior: Behavior normal.        Thought Content: Thought content normal.      Imaging: No results found.

## 2020-04-05 NOTE — Procedures (Signed)
Impression: The above electrodiagnostic study is ABNORMAL and reveals evidence of mild chronic C6 radiculopathy on the right.    There is no significant electrodiagnostic evidence of any other focal nerve entrapment, brachial plexopathy or generalized peripheral neuropathy.   Recommendations: 1.  Follow-up with referring physician. 2.  Continue current management of symptoms.  ___________________________ Laurence Spates FAAPMR Board Certified, American Board of Physical Medicine and Rehabilitation    Nerve Conduction Studies Anti Sensory Summary Table   Stim Site NR Peak (ms) Norm Peak (ms) P-T Amp (V) Norm P-T Amp Site1 Site2 Delta-P (ms) Dist (cm) Vel (m/s) Norm Vel (m/s)  Right Median Acr Palm Anti Sensory (2nd Digit)  30.4C  Wrist    3.4 <3.6 17.4 >10 Wrist Palm 1.7 0.0    Palm    1.7 <2.0 14.8         Right Radial Anti Sensory (Base 1st Digit)  31.2C  Wrist    1.9 <3.1 27.6  Wrist Base 1st Digit 1.9 0.0    Right Ulnar Anti Sensory (5th Digit)  31.1C  Wrist    3.2 <3.7 17.8 >15.0 Wrist 5th Digit 3.2 14.0 44 >38   Motor Summary Table   Stim Site NR Onset (ms) Norm Onset (ms) O-P Amp (mV) Norm O-P Amp Site1 Site2 Delta-0 (ms) Dist (cm) Vel (m/s) Norm Vel (m/s)  Right Median Motor (Abd Poll Brev)  31.5C  Wrist    3.8 <4.2 *4.6 >5 Elbow Wrist 4.0 22.0 55 >50  Elbow    7.8  4.5         Right Ulnar Motor (Abd Dig Min)  31.7C  Wrist    2.9 <4.2 7.8 >3 B Elbow Wrist 3.7 21.5 58 >53  B Elbow    6.6  7.4  A Elbow B Elbow 1.5 10.5 70 >53  A Elbow    8.1  7.2          EMG   Side Muscle Nerve Root Ins Act Fibs Psw Amp Dur Poly Recrt Int Fraser Din Comment  Right Abd Poll Brev Median C8-T1 Nml Nml Nml Nml Nml 0 Nml Nml   Right 1stDorInt Ulnar C8-T1 Nml Nml Nml Nml Nml 0 Nml Nml   Right PronatorTeres Median C6-7 *Incr Nml Nml Nml Nml 0 Nml Nml   Right Biceps Musculocut C5-6 *Incr *1+ *1+ Nml Nml 0 Nml Nml   Right Deltoid Axillary C5-6 Nml Nml Nml Nml Nml 0 Nml Nml   Right Ext Digitorum   Radial (Post Int) C7-8 *Incr Nml Nml Nml Nml 0 Nml Nml     Nerve Conduction Studies Anti Sensory Left/Right Comparison   Stim Site L Lat (ms) R Lat (ms) L-R Lat (ms) L Amp (V) R Amp (V) L-R Amp (%) Site1 Site2 L Vel (m/s) R Vel (m/s) L-R Vel (m/s)  Median Acr Palm Anti Sensory (2nd Digit)  30.4C  Wrist  3.4   17.4  Wrist Palm     Palm  1.7   14.8        Radial Anti Sensory (Base 1st Digit)  31.2C  Wrist  1.9   27.6  Wrist Base 1st Digit     Ulnar Anti Sensory (5th Digit)  31.1C  Wrist  3.2   17.8  Wrist 5th Digit  44    Motor Left/Right Comparison   Stim Site L Lat (ms) R Lat (ms) L-R Lat (ms) L Amp (mV) R Amp (mV) L-R Amp (%) Site1 Site2 L Vel (m/s) R Vel (m/s)  L-R Vel (m/s)  Median Motor (Abd Poll Brev)  31.5C  Wrist  3.8   *4.6  Elbow Wrist  55   Elbow  7.8   4.5        Ulnar Motor (Abd Dig Min)  31.7C  Wrist  2.9   7.8  B Elbow Wrist  58   B Elbow  6.6   7.4  A Elbow B Elbow  70   A Elbow  8.1   7.2           Waveforms:

## 2020-04-07 ENCOUNTER — Telehealth: Payer: Self-pay

## 2020-04-07 ENCOUNTER — Other Ambulatory Visit: Payer: Commercial Managed Care - PPO

## 2020-04-07 ENCOUNTER — Other Ambulatory Visit: Payer: Self-pay

## 2020-04-07 DIAGNOSIS — N200 Calculus of kidney: Secondary | ICD-10-CM

## 2020-04-07 NOTE — Addendum Note (Signed)
Addended by: Iris Pert on: 04/07/2020 04:06 PM   Modules accepted: Orders

## 2020-04-08 ENCOUNTER — Encounter: Payer: Self-pay | Admitting: Urology

## 2020-04-08 DIAGNOSIS — I2510 Atherosclerotic heart disease of native coronary artery without angina pectoris: Secondary | ICD-10-CM | POA: Insufficient documentation

## 2020-04-08 DIAGNOSIS — D751 Secondary polycythemia: Secondary | ICD-10-CM | POA: Insufficient documentation

## 2020-04-08 DIAGNOSIS — J431 Panlobular emphysema: Secondary | ICD-10-CM | POA: Insufficient documentation

## 2020-04-08 DIAGNOSIS — I25119 Atherosclerotic heart disease of native coronary artery with unspecified angina pectoris: Secondary | ICD-10-CM | POA: Insufficient documentation

## 2020-04-08 DIAGNOSIS — R1030 Lower abdominal pain, unspecified: Secondary | ICD-10-CM | POA: Insufficient documentation

## 2020-04-08 LAB — MICROSCOPIC EXAMINATION
Bacteria, UA: NONE SEEN
Casts: NONE SEEN /lpf
Epithelial Cells (non renal): NONE SEEN /hpf (ref 0–10)
WBC, UA: NONE SEEN /hpf (ref 0–5)

## 2020-04-08 LAB — URINALYSIS, ROUTINE W REFLEX MICROSCOPIC
Bilirubin, UA: NEGATIVE
Glucose, UA: NEGATIVE
Ketones, UA: NEGATIVE
Leukocytes,UA: NEGATIVE
Nitrite, UA: NEGATIVE
Protein,UA: NEGATIVE
Specific Gravity, UA: 1.024 (ref 1.005–1.030)
Urobilinogen, Ur: 1 mg/dL (ref 0.2–1.0)
pH, UA: 5.5 (ref 5.0–7.5)

## 2020-04-09 LAB — URINE CULTURE

## 2020-04-12 NOTE — Progress Notes (Signed)
Results sent via my chart 

## 2020-04-13 NOTE — Progress Notes (Signed)
Sent via mychart

## 2020-04-18 NOTE — Telephone Encounter (Signed)
Ua/cx normal. Sent via my chart. 04/13/20

## 2020-04-19 ENCOUNTER — Other Ambulatory Visit: Payer: Self-pay

## 2020-04-21 ENCOUNTER — Other Ambulatory Visit: Payer: Commercial Managed Care - PPO

## 2020-04-27 ENCOUNTER — Ambulatory Visit (INDEPENDENT_AMBULATORY_CARE_PROVIDER_SITE_OTHER): Payer: Commercial Managed Care - PPO | Admitting: Urology

## 2020-04-27 ENCOUNTER — Encounter: Payer: Self-pay | Admitting: Urology

## 2020-04-27 ENCOUNTER — Other Ambulatory Visit: Payer: Self-pay

## 2020-04-27 VITALS — BP 102/63 | HR 65 | Temp 98.4°F | Ht 71.0 in | Wt 205.0 lb

## 2020-04-27 DIAGNOSIS — N200 Calculus of kidney: Secondary | ICD-10-CM | POA: Diagnosis not present

## 2020-04-27 DIAGNOSIS — N401 Enlarged prostate with lower urinary tract symptoms: Secondary | ICD-10-CM

## 2020-04-27 DIAGNOSIS — C61 Malignant neoplasm of prostate: Secondary | ICD-10-CM | POA: Diagnosis not present

## 2020-04-27 DIAGNOSIS — N138 Other obstructive and reflux uropathy: Secondary | ICD-10-CM

## 2020-04-27 DIAGNOSIS — R3912 Poor urinary stream: Secondary | ICD-10-CM

## 2020-04-27 LAB — URINALYSIS, ROUTINE W REFLEX MICROSCOPIC
Bilirubin, UA: NEGATIVE
Glucose, UA: NEGATIVE
Leukocytes,UA: NEGATIVE
Nitrite, UA: NEGATIVE
Specific Gravity, UA: 1.025 (ref 1.005–1.030)
Urobilinogen, Ur: 1 mg/dL (ref 0.2–1.0)
pH, UA: 5.5 (ref 5.0–7.5)

## 2020-04-27 LAB — MICROSCOPIC EXAMINATION
Bacteria, UA: NONE SEEN
Renal Epithel, UA: NONE SEEN /hpf
WBC, UA: NONE SEEN /hpf (ref 0–5)

## 2020-04-27 MED ORDER — TAMSULOSIN HCL 0.4 MG PO CAPS: 0.4000 mg | ORAL_CAPSULE | Freq: Every day | ORAL | 3 refills | Status: DC

## 2020-04-27 NOTE — Patient Instructions (Signed)
Prostate Cancer  The prostate is a male gland that helps make semen. It is located below a man's bladder, in front of the rectum. Prostate cancer is when abnormal cells grow in this gland. What are the causes? The cause of this condition is not known. What increases the risk? You are more likely to develop this condition if:  You are 65 years of age or older.  You are African American.  You have a family history of prostate cancer.  You have a family history of breast cancer. What are the signs or symptoms? Symptoms of this condition include:  A need to pee often.  Peeing that is weak, or pee that stops and starts.  Trouble starting or stopping your pee.  Inability to pee.  Blood in your pee or semen.  Pain in the lower back, lower belly (abdomen), hips, or upper thighs.  Trouble getting an erection.  Trouble emptying all of your pee. How is this treated? Treatment for this condition depends on your age, your health, the kind of treatment you like, and how far the cancer has spread. Treatments include:  Being watched. This is called observation. You will be tested from time to time, but you will not get treated. Tests are to make sure that the cancer is not growing.  Surgery. This may be done to remove the prostate, to remove the testicles, or to freeze or kill cancer cells.  Radiation. This uses a strong beam to kill cancer cells.  Ultrasound energy. This uses strong sound waves to kill cancer cells.  Chemotherapy. This uses medicines that stop cancer cells from increasing. This kills cancer cells and healthy cells.  Targeted therapy. This kills cancer cells only. Healthy cells are not affected.  Hormone treatment. This stops the body from making hormones that help the cancer cells to grow. Follow these instructions at home:  Take over-the-counter and prescription medicines only as told by your doctor.  Eat a healthy diet.  Get plenty of sleep.  Ask your  doctor for help to find a support group for men with prostate cancer.  If you have to go to the hospital, let your cancer doctor (oncologist) know.  Treatment may affect your ability to have sex. Touch, hold, hug, and caress your partner to have intimate moments.  Keep all follow-up visits as told by your doctor. This is important. Contact a doctor if:  You have new or more trouble peeing.  You have new or more blood in your pee.  You have new or more pain in your hips, back, or chest. Get help right away if:  You have weakness in your legs.  You lose feeling in your legs.  You cannot control your pee or your poop (stool).  You have chills or a fever. Summary  The prostate is a male gland that helps make semen.  Prostate cancer is when abnormal cells grow in this gland.  Treatment includes doing surgery, using medicines, using very strong beams, or watching without treatment.  Ask your doctor for help to find a support group for men with prostate cancer.  Contact a doctor if you have problems peeing or have any new pain that you did not have before. This information is not intended to replace advice given to you by your health care provider. Make sure you discuss any questions you have with your health care provider. Document Revised: 02/17/2019 Document Reviewed: 02/17/2019 Elsevier Patient Education  2021 [employer] Inc.  

## 2020-04-27 NOTE — Progress Notes (Signed)
Urological Symptom Review  Patient is experiencing the following symptoms: none   Review of Systems  Gastrointestinal (upper)  : Negative for upper GI symptoms  Gastrointestinal (lower) : Negative for lower GI symptoms  Constitutional : Negative for symptoms  Skin: Negative for skin symptoms  Eyes: Negative for eye symptoms  Ear/Nose/Throat : Sinus problems  Hematologic/Lymphatic: Easy bruising  Cardiovascular : Negative for cardiovascular symptoms  Respiratory : Cough  Endocrine: Excessive thirst  Musculoskeletal: Negative for musculoskeletal symptoms  Neurological: Negative for neurological symptoms  Psychologic: Negative for psychiatric symptoms

## 2020-04-27 NOTE — Progress Notes (Signed)
04/27/2020 3:38 PM   Luis Griffin February 10, 1956 [id number]  Referring provider: Curlene Labrum, MD [place],  [place] [address]  followup prostate cancer and BPH  HPI: Mr Luis Griffin is a 65yo here for followup for prostate cancer, BPH and nephrolithiasis. No stone events since last visit. CT from 04/22/2020 showed a 66mm left upper pole calculus.  PSA 0.13. He has mild LUTS on flomax 0.4mg  daily. Urine stream strong. Nocturia rare. No urinary frequency, he has occasional urgency.    PMH: Past Medical History:  Diagnosis Date  . Coronary artery disease    cardiologsit-  dr Dellis Filbert clevenger  . Depression   . Frequency of urination   . History of MI (myocardial infarction)   . Hyperlipidemia   . Hypertension   . OA (osteoarthritis)   . Productive cough   . Prostate cancer ([place]) UROLOGIST-  DR Alyssamae Klinck/  ONCOLOGIST-  DR MANNING   dx 02/ 2018,  Stage T1c,  PSA 5.0,  Gleason 4+3, vol 45cc  . S/P drug eluting coronary stent placement   . Smokers' cough ([place])   . Systemic lupus erythematosus ([place])   . Urgency of urination   . Wears hearing aid in both ears     Surgical History: Past Surgical History:  Procedure Laterality Date  . CARDIOVASCULAR STRESS TEST  06-03-2007   dr clevenger ([facility])   moderate sized fixed defect in the inferolateral wall probable scar (poss. attenuation artifact),  small area peri-infarct ischemia in the inferolateral region,  segment of inferolateral is akinetic , septal motion consistant w/ post-op changes,  ef 65%,  no evidence significant ischemia  . CORONARY ANGIOPLASTY WITH STENT PLACEMENT  2007  at [facility]   x2 DES per pt  . CORONARY ARTERY BYPASS GRAFT  2006   [place]   x4 per pt  . DOBUTAMINE STRESS ECHO  09/14/2015   dr j. cleveger ([facility])   normal stress echo-- moderate LVH, ef 55-60%, mild inferior wall hypokinesis,  mild AV sclerosis without stenosis,    . PROSTATE BIOPSY  05/02/2016  . RADIOACTIVE SEED  IMPLANT N/A 08/17/2016   Procedure: RADIOACTIVE SEED IMPLANT/BRACHYTHERAPY IMPLANT;  Surgeon: Cleon Gustin, MD;  Location: [facility];  Service: Urology;  Laterality: N/A;  . TONSILLECTOMY  child  . WRIST GANGLION EXCISION Right 2015    Home Medications:  Allergies as of 04/27/2020      Reactions   Varenicline Other (See Comments)   Other reaction(s): Constipation Other reaction(s): Constipation      Medication List       Accurate as of April 27, 2020  3:38 PM. If you have any questions, ask your nurse or doctor.        aspirin 325 MG EC tablet Take by mouth.   atenolol 25 MG tablet Commonly known as: TENORMIN Take 25 mg by mouth 2 (two) times daily.   atorvastatin 40 MG tablet Commonly known as: LIPITOR Take 40 mg by mouth daily.   clopidogrel 75 MG tablet Commonly known as: PLAVIX   diclofenac Sodium 1 % Gel Commonly known as: VOLTAREN Place onto the skin.   ezetimibe 10 MG tablet Commonly known as: ZETIA Take 10 mg by mouth every evening.   lisinopril 10 MG tablet Commonly known as: ZESTRIL Take 20 mg by mouth every evening.   nitroGLYCERIN 0.4 MG SL tablet Commonly known as: NITROSTAT Place under the tongue.   tamsulosin 0.4 MG Caps capsule Commonly known  as: FLOMAX Take 1 capsule (0.4 mg total) by mouth daily after supper.       Allergies:  Allergies  Allergen Reactions  . Varenicline Other (See Comments)    Other reaction(s): Constipation Other reaction(s): Constipation    Family History: Family History  Problem Relation Age of Onset  . Cancer Father        prostate ca  . Kidney cancer Father   . Heart disease Mother   . Lupus Mother   . Thyroid disease Mother   . Rheum arthritis Maternal Aunt   . Lupus Maternal Grandfather     Social History:  reports that he has been smoking cigarettes. He has a 45.00 pack-year smoking history. His smokeless tobacco use includes snuff and chew. He reports current alcohol use.  He reports that he does not use drugs.  ROS: All other review of systems were reviewed and are negative except what is noted above in HPI  Physical Exam: BP 102/63   Pulse 65   Temp 98.4 F (36.9 C)   Ht 5\' 11"  (1.803 m)   Wt 205 lb (93 kg)   BMI 28.59 kg/m   Constitutional:  Alert and oriented, No acute distress. HEENT: [place] AT, moist mucus membranes.  Trachea midline, no masses. Cardiovascular: No clubbing, cyanosis, or edema. Respiratory: Normal respiratory effort, no increased work of breathing. GI: Abdomen is soft, nontender, nondistended, no abdominal masses GU: No CVA tenderness.  Lymph: No cervical or inguinal lymphadenopathy. Skin: No rashes, bruises or suspicious lesions. Neurologic: Grossly intact, no focal deficits, moving all 4 extremities. Psychiatric: Normal mood and affect.  Laboratory Data: Lab Results  Component Value Date   WBC 9.6 08/10/2016   HGB 18.1 (H) 08/10/2016   HCT 51.2 08/10/2016   MCV 88.3 08/10/2016   PLT 219 08/10/2016    Lab Results  Component Value Date   CREATININE 0.87 08/10/2016    No results found for: PSA  No results found for: TESTOSTERONE  No results found for: HGBA1C  Urinalysis    Component Value Date/Time   COLORURINE YELLOW 04/11/2016 1600   APPEARANCEUR Clear 04/07/2020 1603   LABSPEC 1.010 09/07/2016 1116   PHURINE 6.0 09/07/2016 1116   PHURINE 5.0 04/11/2016 1600   GLUCOSEU Negative 04/07/2020 1603   GLUCOSEU Negative 09/07/2016 1116   HGBUR Small 09/07/2016 1116   HGBUR MODERATE (A) 04/11/2016 1600   BILIRUBINUR Negative 04/07/2020 1603   BILIRUBINUR Negative 09/07/2016 1116   KETONESUR Negative 09/07/2016 1116   KETONESUR NEGATIVE 04/11/2016 1600   PROTEINUR Negative 04/07/2020 1603   PROTEINUR Negative 09/07/2016 1116   PROTEINUR NEGATIVE 04/11/2016 1600   UROBILINOGEN 0.2 09/07/2016 1116   NITRITE Negative 04/07/2020 1603   NITRITE Negative 09/07/2016 1116   NITRITE NEGATIVE 04/11/2016 1600    LEUKOCYTESUR Negative 04/07/2020 1603   LEUKOCYTESUR Negative 09/07/2016 1116    Lab Results  Component Value Date   LABMICR See below: 04/07/2020   WBCUA None seen 04/07/2020   LABEPIT None seen 04/07/2020   BACTERIA None seen 04/07/2020    Pertinent Imaging: CT 04/22/2020: Images reviewed and discussed with the patient.  Results for orders placed during the hospital encounter of 10/22/19  Abdomen 1 view (KUB)  Narrative CLINICAL DATA:  Right flank pain and hematuria  EXAM: ABDOMEN - 1 VIEW  COMPARISON:  October 02, 2018  FINDINGS: There is an apparent phlebolith in the lateral left pelvis, stable. There is a nearby surgical clip. There is a 1 mm calcification in the lower  right pelvis, stable, a likely phlebolith. There is a 3 mm calcification superior to the left kidney, stable. No new calcifications are appreciable.  There are seed implants in the prostate region. There is moderate stool in the colon. No bowel dilatation or air-fluid level to suggest bowel obstruction. No free air or portal venous air.  IMPRESSION: No new calcifications to suggest renal or ureteral calculus. Apparent phleboliths in the pelvis.  Seed implants noted in the prostate region.  No evident bowel obstruction or free air on supine examination.   Electronically Signed By: Lowella Grip III M.D. On: 10/23/2019 08:16  No results found for this or any previous visit.  No results found for this or any previous visit.  No results found for this or any previous visit.  Results for orders placed during the hospital encounter of 09/18/17  US RENAL  Narrative CLINICAL DATA:  Follow-up examination for right-sided ureteral calculus.  EXAM: RENAL / URINARY TRACT ULTRASOUND COMPLETE  COMPARISON:  Prior CT from 05/23/2016.  FINDINGS: Right Kidney:  Length: 15.6 cm. Echogenicity within normal limits. No hydronephrosis. 1.0 x 0.9 x 1.0 cm minimally complex cyst present within the  interpolar right kidney grossly similar to previous. No shadowing echogenic calculi identified.  Left Kidney:  Length: 13.9 cm. Echogenicity within normal limits. No mass or hydronephrosis visualized. No shadowing echogenic calculi identified.  Bladder:  Appears normal for degree of bladder distention. Bilateral ureteral jets identified.  IMPRESSION: 1. No hydronephrosis.  No sonographic evidence for nephrolithiasis. 2. 1 cm minimally complex right renal cyst. Finding is almost certainly benign, and grossly similar as compared to prior CT from 2018.   Electronically Signed By: Jeannine Boga M.D. On: 09/18/2017 17:45  No results found for this or any previous visit.  No results found for this or any previous visit.  No results found for this or any previous visit.   Assessment & Plan:    1. Prostate cancer ([place]) -RTC 6 months with PSA - Urinalysis, Routine w reflex microscopic  2. Nephrolithiasis -RTC 1 year with KUB  3. Weak urinary stream -continue flomax 0.4mg   4. Benign prostatic hyperplasia with urinary obstruction -COntinue flomax 0.4mg  daily   No follow-ups on file.  Nicolette Bang, MD  [facility] [place]

## 2020-05-04 ENCOUNTER — Other Ambulatory Visit: Payer: Self-pay | Admitting: Oncology

## 2020-05-04 DIAGNOSIS — R911 Solitary pulmonary nodule: Secondary | ICD-10-CM

## 2020-05-12 ENCOUNTER — Other Ambulatory Visit (HOSPITAL_COMMUNITY): Payer: Self-pay | Admitting: Radiology

## 2020-05-12 DIAGNOSIS — J431 Panlobular emphysema: Secondary | ICD-10-CM

## 2020-05-12 DIAGNOSIS — R911 Solitary pulmonary nodule: Secondary | ICD-10-CM

## 2020-05-16 ENCOUNTER — Encounter (HOSPITAL_COMMUNITY)
Admission: RE | Admit: 2020-05-16 | Discharge: 2020-05-16 | Disposition: A | Discharge status: Home / Self Care | Payer: Commercial Managed Care - PPO | Source: Ambulatory Visit | Attending: Oncology | Admitting: Oncology

## 2020-05-16 ENCOUNTER — Other Ambulatory Visit: Payer: Self-pay

## 2020-05-16 DIAGNOSIS — R911 Solitary pulmonary nodule: Secondary | ICD-10-CM | POA: Insufficient documentation

## 2020-05-16 LAB — GLUCOSE, CAPILLARY: Glucose-Capillary: 100 mg/dL — ABNORMAL HIGH (ref 70–99)

## 2020-05-16 MED ORDER — FLUDEOXYGLUCOSE F - 18 (FDG) INJECTION
10.0000 | Freq: Once | INTRAVENOUS | Status: AC | PRN
  Administered 2020-05-16: 10.65 via INTRAVENOUS

## 2020-05-17 ENCOUNTER — Other Ambulatory Visit (HOSPITAL_COMMUNITY)
Admission: RE | Admit: 2020-05-17 | Discharge: 2020-05-17 | Disposition: A | Discharge status: Home / Self Care | Payer: Commercial Managed Care - PPO | Source: Ambulatory Visit | Attending: Oncology | Admitting: Oncology

## 2020-05-17 DIAGNOSIS — Z20822 Contact with and (suspected) exposure to covid-19: Secondary | ICD-10-CM | POA: Diagnosis not present

## 2020-05-17 LAB — SARS CORONAVIRUS 2 (TAT 6-24 HRS): SARS Coronavirus 2: NEGATIVE

## 2020-05-20 ENCOUNTER — Ambulatory Visit (HOSPITAL_COMMUNITY)
Admission: RE | Admit: 2020-05-20 | Discharge: 2020-05-20 | Disposition: A | Discharge status: Home / Self Care | Payer: Commercial Managed Care - PPO | Source: Ambulatory Visit | Attending: Oncology | Admitting: Oncology

## 2020-05-20 ENCOUNTER — Other Ambulatory Visit: Payer: Self-pay

## 2020-05-20 DIAGNOSIS — J431 Panlobular emphysema: Secondary | ICD-10-CM | POA: Diagnosis present

## 2020-05-20 DIAGNOSIS — R911 Solitary pulmonary nodule: Secondary | ICD-10-CM | POA: Insufficient documentation

## 2020-05-20 LAB — PULMONARY FUNCTION TEST
DL/VA % pred: 74 %
DL/VA: 3.08 ml/min/mmHg/L
DLCO unc % pred: 66 %
DLCO unc: 18.39 ml/min/mmHg
FEF 25-75 Post: 1.2 L/sec
FEF 25-75 Pre: 1.64 L/sec
FEF2575-%Change-Post: -27 %
FEF2575-%Pred-Post: 41 %
FEF2575-%Pred-Pre: 57 %
FEV1-%Change-Post: -5 %
FEV1-%Pred-Post: 79 %
FEV1-%Pred-Pre: 84 %
FEV1-Post: 2.87 L
FEV1-Pre: 3.04 L
FEV1FVC-%Change-Post: 1 %
FEV1FVC-%Pred-Pre: 88 %
FEV6-%Change-Post: -6 %
FEV6-%Pred-Post: 88 %
FEV6-%Pred-Pre: 94 %
FEV6-Post: 4.06 L
FEV6-Pre: 4.34 L
FEV6FVC-%Change-Post: 0 %
FEV6FVC-%Pred-Post: 100 %
FEV6FVC-%Pred-Pre: 99 %
FVC-%Change-Post: -6 %
FVC-%Pred-Post: 88 %
FVC-%Pred-Pre: 95 %
FVC-Post: 4.26 L
FVC-Pre: 4.57 L
Post FEV1/FVC ratio: 67 %
Post FEV6/FVC ratio: 95 %
Pre FEV1/FVC ratio: 66 %
Pre FEV6/FVC Ratio: 95 %
RV % pred: 97 %
RV: 2.33 L
TLC % pred: 95 %
TLC: 6.86 L

## 2020-05-20 MED ORDER — ALBUTEROL SULFATE (2.5 MG/3ML) 0.083% IN NEBU
2.5000 mg | INHALATION_SOLUTION | Freq: Once | RESPIRATORY_TRACT | Status: AC
  Administered 2020-05-20: 2.5 mg via RESPIRATORY_TRACT

## 2020-06-08 DIAGNOSIS — Z716 Tobacco abuse counseling: Secondary | ICD-10-CM | POA: Insufficient documentation

## 2020-06-08 DIAGNOSIS — Z021 Encounter for pre-employment examination: Secondary | ICD-10-CM | POA: Insufficient documentation

## 2020-10-25 ENCOUNTER — Other Ambulatory Visit: Payer: Self-pay

## 2020-10-25 ENCOUNTER — Other Ambulatory Visit: Payer: Commercial Managed Care - PPO

## 2020-10-25 DIAGNOSIS — C61 Malignant neoplasm of prostate: Secondary | ICD-10-CM

## 2020-10-26 ENCOUNTER — Telehealth: Payer: Commercial Managed Care - PPO | Admitting: Urology

## 2020-10-26 ENCOUNTER — Ambulatory Visit: Payer: Commercial Managed Care - PPO | Admitting: Urology

## 2020-10-26 LAB — PSA: Prostate Specific Ag, Serum: <0.1 ng/mL (ref 0.0–4.0)

## 2020-10-28 ENCOUNTER — Other Ambulatory Visit: Payer: Self-pay

## 2020-10-28 ENCOUNTER — Telehealth (INDEPENDENT_AMBULATORY_CARE_PROVIDER_SITE_OTHER): Payer: Commercial Managed Care - PPO | Admitting: Urology

## 2020-10-28 ENCOUNTER — Encounter: Payer: Self-pay | Admitting: Urology

## 2020-10-28 DIAGNOSIS — N401 Enlarged prostate with lower urinary tract symptoms: Secondary | ICD-10-CM

## 2020-10-28 DIAGNOSIS — N138 Other obstructive and reflux uropathy: Secondary | ICD-10-CM

## 2020-10-28 DIAGNOSIS — C61 Malignant neoplasm of prostate: Secondary | ICD-10-CM

## 2020-10-28 DIAGNOSIS — R3912 Poor urinary stream: Secondary | ICD-10-CM | POA: Diagnosis not present

## 2020-10-28 DIAGNOSIS — N200 Calculus of kidney: Secondary | ICD-10-CM

## 2020-10-28 MED ORDER — TAMSULOSIN HCL 0.4 MG PO CAPS: 0.4000 mg | ORAL_CAPSULE | Freq: Every day | ORAL | 3 refills | Status: DC

## 2020-10-28 NOTE — Progress Notes (Signed)
10/28/2020 3:04 PM   Luis Griffin [birth year] [id number]  Referring provider: Curlene Labrum, MD [place],  [place] [address]  Patient location: home Physician location: office I connected with  Luis Griffin on 10/28/20 by a video enabled telemedicine application and verified that I am speaking with the correct person using two identifiers.   I discussed the limitations of evaluation and management by telemedicine. The patient expressed understanding and agreed to proceed.    Followup prostate cancer and BPH   HPI: Luis Griffin is a 65yo here followup for prostate cancer and BPH. PSA undetectable. He has stable LUTS on flomax 0.4mg  daily. Urine stream is strong. Nocturia 1-2x. No hematuria or dysuria. No stone events since last visit. No flank pain. He is scheduled for lung biopsy next week at [facility].    PMH: Past Medical History:  Diagnosis Date   Coronary artery disease    cardiologsit-  dr Dellis Filbert clevenger   Depression    Frequency of urination    History of MI (myocardial infarction)    Hyperlipidemia    Hypertension    OA (osteoarthritis)    Productive cough    Prostate cancer ([place]) UROLOGIST-  DR Kamilah Correia/  ONCOLOGIST-  DR MANNING   dx 02/ 2018,  Stage T1c,  PSA 5.0,  Gleason 4+3, vol 45cc   S/P drug eluting coronary stent placement    Smokers' cough ([place])    Systemic lupus erythematosus ([place])    Urgency of urination    Wears hearing aid in both ears     Surgical History: Past Surgical History:  Procedure Laterality Date   CARDIOVASCULAR STRESS TEST  06-03-2007   dr clevenger ([facility])   moderate sized fixed defect in the inferolateral wall probable scar (poss. attenuation artifact),  small area peri-infarct ischemia in the inferolateral region,  segment of inferolateral is akinetic , septal motion consistant w/ post-op changes,  ef 65%,  no evidence significant ischemia   CORONARY ANGIOPLASTY WITH STENT PLACEMENT  2007  at [facility]   x2 DES  per pt   CORONARY ARTERY BYPASS GRAFT  2006   [place]   x4 per pt   DOBUTAMINE STRESS ECHO  09/14/2015   dr j. cleveger ([facility])   normal stress echo-- moderate LVH, ef 55-60%, mild inferior wall hypokinesis,  mild AV sclerosis without stenosis,     PROSTATE BIOPSY  05/02/2016   RADIOACTIVE SEED IMPLANT N/A 08/17/2016   Procedure: RADIOACTIVE SEED IMPLANT/BRACHYTHERAPY IMPLANT;  Surgeon: Cleon Gustin, MD;  Location: [facility];  Service: Urology;  Laterality: N/A;   TONSILLECTOMY  child   WRIST GANGLION EXCISION Right 2015    Home Medications:  Allergies as of 10/28/2020       Reactions   Varenicline Other (See Comments)   Other reaction(s): Constipation Other reaction(s): Constipation        Medication List        Accurate as of October 28, 2020  3:04 PM. If you have any questions, ask your nurse or doctor.          aspirin 325 MG EC tablet Take by mouth.   atenolol 25 MG tablet Commonly known as: TENORMIN Take 25 mg by mouth 2 (two) times daily.   atorvastatin 40 MG tablet Commonly known as: LIPITOR Take 40 mg by mouth daily.   clopidogrel 75 MG tablet Commonly known as: PLAVIX   diclofenac Sodium 1 % Gel Commonly known as: VOLTAREN  Place onto the skin.   ezetimibe 10 MG tablet Commonly known as: ZETIA Take 10 mg by mouth every evening.   lisinopril 10 MG tablet Commonly known as: ZESTRIL Take 20 mg by mouth every evening.   nitroGLYCERIN 0.4 MG SL tablet Commonly known as: NITROSTAT Place under the tongue.   tamsulosin 0.4 MG Caps capsule Commonly known as: FLOMAX Take 1 capsule (0.4 mg total) by mouth daily after supper.        Allergies:  Allergies  Allergen Reactions   Varenicline Other (See Comments)    Other reaction(s): Constipation Other reaction(s): Constipation    Family History: Family History  Problem Relation Age of Onset   Cancer Father        prostate ca   Kidney cancer Father     Heart disease Mother    Lupus Mother    Thyroid disease Mother    Rheum arthritis Maternal Aunt    Lupus Maternal Grandfather     Social History:  reports that he has been smoking cigarettes. He has a 45.00 pack-year smoking history. His smokeless tobacco use includes snuff and chew. He reports current alcohol use. He reports that he does not use drugs.  ROS: All other review of systems were reviewed and are negative except what is noted above in HPI   Laboratory Data: Lab Results  Component Value Date   WBC 9.6 08/10/2016   HGB 18.1 (H) 08/10/2016   HCT 51.2 08/10/2016   MCV 88.3 08/10/2016   PLT 219 08/10/2016    Lab Results  Component Value Date   CREATININE 0.87 08/10/2016    No results found for: PSA  No results found for: TESTOSTERONE  No results found for: HGBA1C  Urinalysis    Component Value Date/Time   COLORURINE YELLOW 04/11/2016 1600   APPEARANCEUR Clear 04/27/2020 1505   LABSPEC 1.010 09/07/2016 1116   PHURINE 6.0 09/07/2016 1116   PHURINE 5.0 04/11/2016 1600   GLUCOSEU Negative 04/27/2020 1505   GLUCOSEU Negative 09/07/2016 1116   HGBUR Small 09/07/2016 1116   HGBUR MODERATE (A) 04/11/2016 1600   BILIRUBINUR Negative 04/27/2020 1505   BILIRUBINUR Negative 09/07/2016 1116   KETONESUR Negative 09/07/2016 1116   KETONESUR NEGATIVE 04/11/2016 1600   PROTEINUR Trace (A) 04/27/2020 1505   PROTEINUR Negative 09/07/2016 1116   PROTEINUR NEGATIVE 04/11/2016 1600   UROBILINOGEN 0.2 09/07/2016 1116   NITRITE Negative 04/27/2020 1505   NITRITE Negative 09/07/2016 1116   NITRITE NEGATIVE 04/11/2016 1600   LEUKOCYTESUR Negative 04/27/2020 1505   LEUKOCYTESUR Negative 09/07/2016 1116    Lab Results  Component Value Date   LABMICR See below: 04/27/2020   WBCUA None seen 04/27/2020   LABEPIT 0-10 04/27/2020   MUCUS Present 04/27/2020   BACTERIA None seen 04/27/2020    Pertinent Imaging:  Results for orders placed during the hospital encounter of  10/22/19  Abdomen 1 view (KUB)  Narrative CLINICAL DATA:  Right flank pain and hematuria  EXAM: ABDOMEN - 1 VIEW  COMPARISON:  October 02, 2018  FINDINGS: There is an apparent phlebolith in the lateral left pelvis, stable. There is a nearby surgical clip. There is a 1 mm calcification in the lower right pelvis, stable, a likely phlebolith. There is a 3 mm calcification superior to the left kidney, stable. No new calcifications are appreciable.  There are seed implants in the prostate region. There is moderate stool in the colon. No bowel dilatation or air-fluid level to suggest bowel obstruction. No free air or  portal venous air.  IMPRESSION: No new calcifications to suggest renal or ureteral calculus. Apparent phleboliths in the pelvis.  Seed implants noted in the prostate region.  No evident bowel obstruction or free air on supine examination.   Electronically Signed By: Lowella Grip III M.D. On: 10/23/2019 08:16  No results found for this or any previous visit.  No results found for this or any previous visit.  No results found for this or any previous visit.  Results for orders placed during the hospital encounter of 09/18/17  US RENAL  Narrative CLINICAL DATA:  Follow-up examination for right-sided ureteral calculus.  EXAM: RENAL / URINARY TRACT ULTRASOUND COMPLETE  COMPARISON:  Prior CT from 05/23/2016.  FINDINGS: Right Kidney:  Length: 15.6 cm. Echogenicity within normal limits. No hydronephrosis. 1.0 x 0.9 x 1.0 cm minimally complex cyst present within the interpolar right kidney grossly similar to previous. No shadowing echogenic calculi identified.  Left Kidney:  Length: 13.9 cm. Echogenicity within normal limits. No mass or hydronephrosis visualized. No shadowing echogenic calculi identified.  Bladder:  Appears normal for degree of bladder distention. Bilateral ureteral jets identified.  IMPRESSION: 1. No hydronephrosis.  No  sonographic evidence for nephrolithiasis. 2. 1 cm minimally complex right renal cyst. Finding is almost certainly benign, and grossly similar as compared to prior CT from 2018.   Electronically Signed By: Jeannine Boga M.D. On: 09/18/2017 17:45  No results found for this or any previous visit.  No results found for this or any previous visit.  No results found for this or any previous visit.   Assessment & Plan:    1. Prostate cancer ([place]) -RTC 6 months with PSA  2. Nephrolithiasis -RTC 6 months with KUB  3. Weak urinary stream -Continue flomax 0.4mg  daily  4. Benign prostatic hyperplasia with urinary obstruction -Continue flomax 0.4mg  daily   No follow-ups on file.  Nicolette Bang, MD  [facility] [place]

## 2020-10-28 NOTE — Patient Instructions (Signed)
Prostate Cancer  The prostate is a small gland (1.5 inches [3.8 cm] wide and 1 inch [2.5 cm] high) that is involved in the production of semen. It is located below a man's bladder, in front of the rectum. Prostate cancer is the abnormal growth ofcells in the prostate gland. What are the causes? The exact cause of this condition is not known. What increases the risk? You are more likely to develop this condition if: You are 65 years of age or older. You are African American. You have a family history of prostate cancer. You have a family history of breast cancer. What are the signs or symptoms? Symptoms of this condition include: A need to urinate often. Weak or interrupted flow of urine. Trouble starting or stopping urination. Inability to urinate. Blood in urine or semen. Persistent pain or discomfort in the lower back, lower abdomen, hips, or upper thighs. Trouble getting an erection. Trouble emptying the bladder all the way. How is this diagnosed? This condition can be diagnosed with: A digital rectal exam. For this exam, a health care provider inserts a gloved finger into the rectum to feel the prostate gland. A blood test called a prostate-specific antigen (PSA) test. A procedure in which a sample of tissue is taken from the prostate and checked under a microscope (prostate biopsy). An imaging test called transrectal ultrasonography. Once the condition is diagnosed, tests will be done to determine how far the cancer has spread. This is called staging the cancer. Staging may involve imaging tests, such as: A bone scan. A CT scan. A PET scan. An MRI. The stages of prostate cancer are as follows: Stage I. At this stage, the cancer is found in the prostate only. The cancer is not visible on imaging tests, and it is usually found by accident, such as during prostate surgery. Stage II. At this stage, the cancer is more advanced than it is in stage I, but the cancer has not spread  outside the prostate. Stage III. At this stage, the cancer has spread beyond the outer layer of the prostate to nearby tissues. The cancer may be found in the seminal vesicles, which are near the bladder and the prostate. Stage IV. At this stage, the cancer has spread to other parts of the body, such as the lymph nodes, bones, bladder, rectum, liver, or lungs. How is this treated? Treatment for this condition depends on several factors, including the stage of the cancer, your age, personal preferences, and your overall health. Talk with your health care provider about treatment options that are recommended for you. Common treatments include: Observation for early stage prostate cancer (active surveillance). This involves having exams, blood tests, and in some cases, more biopsies. For some men, this is the only treatment needed. Surgery. Types of surgeries include: Open surgery (prostatectomy). In this surgery, a larger incision is made to remove the prostate. A laparoscopic prostatectomy. This is a surgery to remove the prostate and lymph nodes through several, small incisions. It is often referred to as a minimally invasive surgery. A robotic prostatectomy. This is laparoscopic surgery to remove the prostate and lymph nodes with the help of robotic arms that are controlled by the surgeon. Orchiectomy. This is surgery to remove the testicles. Cryosurgery. This is surgery to freeze and destroy cancer cells. Radiation treatment. Types of radiation treatment include: External beam radiation. This type aims beams of radiation from outside the body at the prostate to destroy cancerous cells. Brachytherapy. This type uses radioactive needles,   seeds, wires, or tubes that are implanted into the prostate gland. Like external beam radiation, brachytherapy destroys cancerous cells. An advantage is that this type of radiation limits the damage to surrounding tissue and has fewer side effects. High-intensity,  focused ultrasonography. This treatment destroys cancer cells by delivering high-energy ultrasound waves to the cancerous cells. Chemotherapy medicines. This treatment kills cancer cells or stops them from multiplying. It kills both cancer cells and normal cells. Targeted therapy. This treatment uses medicines to kill cancer cells without damaging normal cells. Hormone treatment. This treatment involves taking medicines that act on one of the male hormones (testosterone): By stopping your body from producing testosterone. By blocking testosterone from reaching cancer cells. Follow these instructions at home: Take over-the-counter and prescription medicines only as told by your health care provider. Maintain a healthy diet. Get plenty of sleep. Consider joining a support group for men who have prostate cancer. Meeting with a support group may help you learn to manage the stress of having cancer. If you have to go to the hospital, notify your cancer specialist (oncologist). Treatment for prostate cancer may affect sexual function. Continue to have intimate moments with your partner. This may include touching, holding, hugging, and caressing. Keep all follow-up visits as told by your health care provider. This is important. Contact a health care provider if: You have new or increasing trouble urinating. You have new or increasing blood in your urine. You have new or increasing pain in your hips, back, or chest. Get help right away if: You have weakness or numbness in your legs. You cannot control urination or your bowel movements (incontinence). You have chills or a fever. Summary The prostate is a small gland that is involved in the production of semen. It is located below a man's bladder, in front of the rectum. Prostate cancer is the abnormal growth of cells in the prostate gland. Treatment for this condition depends on the stage of the cancer, your age, personal preferences, and your  overall health. Talk with your health care provider about treatment options that are recommended for you. Consider joining a support group for men who have prostate cancer. Meeting with a support group may help you learn to cope with the stress of having cancer. This information is not intended to replace advice given to you by your health care provider. Make sure you discuss any questions you have with your healthcare provider. Document Revised: 02/17/2019 Document Reviewed: 02/17/2019 Elsevier Patient Education  2022 [employer]  

## 2020-11-23 DIAGNOSIS — C3431 Malignant neoplasm of lower lobe, right bronchus or lung: Secondary | ICD-10-CM | POA: Insufficient documentation

## 2020-12-22 DIAGNOSIS — Z452 Encounter for adjustment and management of vascular access device: Secondary | ICD-10-CM | POA: Insufficient documentation

## 2021-01-09 NOTE — Progress Notes (Signed)
Office Visit Note  Patient: Luis Griffin             Date of Birth: June 11, 1955           MRN: [id number]             PCP: Curlene Labrum, MD Referring: Curlene Labrum, MD Visit Date: 01/10/2021   Subjective:  Pain of the Right Hand (Right 4th and 5th digit locking, patient diagnosed with lung cancer 10/2020, starting chemo 01/11/2021)   History of Present Illness: Luis Griffin is a 65 y.o. male here for follow up with joint pains initial evaluation with negative inflammatory markers and negative repeat ANA with some osteoarthritis changes and trigger finger, steroid injection of 5th digit was performed at the time. He felt good improvement in the trigger finger symptoms until the past week has return of triggering of 4th and 5th finger, 5th finger worst. Since our last visit he was diagnosed with adenocarcinoma of the lung with lobectomy and upcoming plan to start chemotherapy, first round starting tomorrow.  Previous HPI 03/01/20 Luis Griffin is a 65 y.o. male with history of prostate cancer and CAD here for evaluation of positive ANA and arthralgias. He has a long history of generalized joint pains but in the past couple months his arms are having a lot of pain especially in the right hand and the elbows. He has triggering of the right small finger that is increasing. He also notices numbness of fingers in the right hand that worsens when holding a steering for prolonged duration. He has some bony enlargement of the hadns but does not notice much soft tissue swelling or redness.    Review of Systems  Constitutional:  Positive for fatigue.  HENT:  Negative for mouth dryness.   Eyes:  Negative for dryness.  Respiratory:  Positive for shortness of breath.   Cardiovascular:  Negative for swelling in legs/feet.  Gastrointestinal:  Negative for constipation.  Endocrine: Negative for excessive thirst.  Genitourinary:  Negative for difficulty urinating.  Musculoskeletal:   Positive for joint pain, joint pain and morning stiffness.  Skin:  Negative for rash.  Allergic/Immunologic: Negative for susceptible to infections.  Neurological:  Positive for numbness.  Hematological:  Negative for bruising/bleeding tendency.  Psychiatric/Behavioral:  Positive for sleep disturbance.    PMFS History:  Patient Active Problem List   Diagnosis Date Noted   Encounter for insertion of venous access port 12/22/2020   Malignant neoplasm of lower lobe of right lung ([place]) 11/23/2020   Encounter for smoking cessation counseling 06/08/2020   Nephrolithiasis 04/27/2020   Coronary artery disease involving native coronary artery of native heart with angina pectoris ([place]) 04/08/2020   Lower abdominal pain 04/08/2020   Panlobular emphysema ([place]) 04/08/2020   Polycythemia 04/08/2020   Numbness and tingling in right hand 03/01/2020   Trigger finger, right little finger 03/01/2020   Polyarthralgia 03/01/2020   Rash and other nonspecific skin eruption 03/01/2020   Weak urinary stream 04/22/2019   Benign prostatic hyperplasia with urinary obstruction 04/22/2019   Old MI (myocardial infarction) 09/10/2016   Dysuria 09/07/2016   Prostate cancer ([place]) 05/30/2016   Internal hemorrhoids 01/08/2011   Depressive disorder 12/05/2009   Hypertension, benign 12/05/2009    Past Medical History:  Diagnosis Date   Coronary artery disease    cardiologsit-  dr Dellis Filbert clevenger   Depression    Frequency of urination    History of MI (myocardial infarction)    Hyperlipidemia  Hypertension    Lung cancer ([place])    OA (osteoarthritis)    Productive cough    Prostate cancer ([place]) UROLOGIST-  DR MCKENZIE/  ONCOLOGIST-  DR MANNING   dx 02/ 2018,  Stage T1c,  PSA 5.0,  Gleason 4+3, vol 45cc   S/P drug eluting coronary stent placement    Smokers' cough ([place])    Systemic lupus erythematosus ([place])    Urgency of urination    Wears hearing aid in both ears     Family History  Problem Relation  Age of Onset   Cancer Father        prostate ca   Kidney cancer Father    Heart disease Mother    Lupus Mother    Thyroid disease Mother    Rheum arthritis Maternal Aunt    Lupus Maternal Grandfather    Past Surgical History:  Procedure Laterality Date   CARDIOVASCULAR STRESS TEST  06-03-2007   dr clevenger ([facility])   moderate sized fixed defect in the inferolateral wall probable scar (poss. attenuation artifact),  small area peri-infarct ischemia in the inferolateral region,  segment of inferolateral is akinetic , septal motion consistant w/ post-op changes,  ef 65%,  no evidence significant ischemia   CORONARY ANGIOPLASTY WITH STENT PLACEMENT  2007  at [facility]   x2 DES per pt   CORONARY ARTERY BYPASS GRAFT  2006   [place]   x4 per pt   DOBUTAMINE STRESS ECHO  09/14/2015   dr j. cleveger ([facility])   normal stress echo-- moderate LVH, ef 55-60%, mild inferior wall hypokinesis,  mild AV sclerosis without stenosis,     LUNG CANCER SURGERY Right    PROSTATE BIOPSY  05/02/2016   RADIOACTIVE SEED IMPLANT N/A 08/17/2016   Procedure: RADIOACTIVE SEED IMPLANT/BRACHYTHERAPY IMPLANT;  Surgeon: Cleon Gustin, MD;  Location: [facility];  Service: Urology;  Laterality: N/A;   TONSILLECTOMY  child   WRIST GANGLION EXCISION Right 2015   Social History   Social History Narrative   Not on file   Immunization History  Administered Date(s) Administered   Influenza Inj Mdck Quad Pf 01/04/2017, 01/31/2018   Influenza Split 04/15/2006   Influenza, Seasonal, Injecte, Preservative Fre 04/15/2006   Influenza,inj,quad, With Preservative 12/24/2018   Moderna Sars-Covid-2 Vaccination 05/15/2019, 05/18/2019, 06/12/2019, 06/18/2019, 02/17/2020, 02/20/2020   Pneumococcal Conjugate-13 09/01/2012   Td 06/26/2001   Tdap 02/25/2014   Zoster Recombinat (Shingrix) 09/23/2018, 11/25/2018   Zoster, Live 03/05/2016     Objective: Vital Signs: BP 134/73 (BP  Location: Right Arm, Patient Position: Sitting, Cuff Size: Normal)   Pulse 66   Resp 16   Ht 5\' 11"  (1.803 m)   Wt 212 lb (96.2 kg)   BMI 29.57 kg/m    Physical Exam Skin:    General: Skin is warm and dry.     Findings: No rash.  Psychiatric:        Mood and Affect: Mood normal.     Musculoskeletal Exam:  Right 5th finger triggering flexion of both MCP and PIP at about 90 degrees with most pain around MCP volar surface with pressure and movement, no surrounding soft tissue swelling 4th digit without significant focal tenderness, nodule, or triggering on exam  Investigation: No additional findings.  Imaging: No results found.  Recent Labs: Lab Results  Component Value Date   WBC 9.6 08/10/2016   HGB 18.1 (H) 08/10/2016   PLT 219 08/10/2016   NA 140 08/10/2016  K 4.2 08/10/2016   CL 110 08/10/2016   CO2 22 08/10/2016   GLUCOSE 96 08/10/2016   BUN 17 08/10/2016   CREATININE 0.87 08/10/2016   BILITOT 0.7 08/10/2016   ALKPHOS 84 08/10/2016   AST 20 08/10/2016   ALT 26 08/10/2016   PROT 7.1 08/10/2016   ALBUMIN 4.3 08/10/2016   CALCIUM 9.1 08/10/2016   GFRAA >60 08/10/2016    Speciality Comments: No specialty comments available.  Procedures:  Hand/UE Inj: R small A1 for trigger finger on 01/10/2021 12:25 PM Indications: pain and tendon swelling Details: 25 G needle, volar approach Medications: 0.5 mL lidocaine 1 %; 20 mg triamcinolone acetonide 40 MG/ML Procedure, treatment alternatives, risks and benefits explained, specific risks discussed. Consent was given by the patient. Immediately prior to procedure a time out was called to verify the correct patient, procedure, equipment, support staff and site/side marked as required. Patient was prepped and draped in the usual sterile fashion.    Allergies: Varenicline   Assessment / Plan:     Visit Diagnoses: Trigger finger, right little finger  Significant triggering of the 5th finger, A1 pulley injection today  with 1% lidocaine and 20 mg kenalog. I was not sure if A2 also involved as much I recommend if he does not have great relief we can also treat for this site. 4th finger much less involved and just past week mostly, so recommending wait and see for now.  Orders: Orders Placed This Encounter  Procedures   Hand/UE Inj    No orders of the defined types were placed in this encounter.    Follow-Up Instructions: Return if symptoms worsen or fail to improve.   Collier Salina, MD  Note - This record has been created using [employer].  Chart creation errors have been sought, but may not always  have been located. Such creation errors do not reflect on  the standard of medical care.

## 2021-01-10 ENCOUNTER — Ambulatory Visit (INDEPENDENT_AMBULATORY_CARE_PROVIDER_SITE_OTHER): Payer: Commercial Managed Care - PPO | Admitting: Internal Medicine

## 2021-01-10 ENCOUNTER — Other Ambulatory Visit: Payer: Self-pay

## 2021-01-10 ENCOUNTER — Encounter: Payer: Self-pay | Admitting: Internal Medicine

## 2021-01-10 VITALS — BP 134/73 | HR 66 | Resp 16 | Ht 71.0 in | Wt 212.0 lb

## 2021-01-10 DIAGNOSIS — M65351 Trigger finger, right little finger: Secondary | ICD-10-CM

## 2021-01-10 MED ORDER — TRIAMCINOLONE ACETONIDE 40 MG/ML IJ SUSP
20.0000 mg | INTRAMUSCULAR | Status: AC | PRN
  Administered 2021-01-10: 20 mg

## 2021-01-10 MED ORDER — LIDOCAINE HCL 1 % IJ SOLN
0.5000 mL | INTRAMUSCULAR | Status: AC | PRN
  Administered 2021-01-10: .5 mL

## 2021-04-24 ENCOUNTER — Other Ambulatory Visit: Payer: Self-pay

## 2021-04-24 ENCOUNTER — Other Ambulatory Visit: Payer: Commercial Managed Care - PPO

## 2021-04-24 DIAGNOSIS — C61 Malignant neoplasm of prostate: Secondary | ICD-10-CM

## 2021-04-25 LAB — PSA: Prostate Specific Ag, Serum: <0.1 ng/mL (ref 0.0–4.0)

## 2021-04-27 ENCOUNTER — Ambulatory Visit (HOSPITAL_COMMUNITY)
Admission: RE | Admit: 2021-04-27 | Discharge: 2021-04-27 | Disposition: A | Discharge status: Home / Self Care | Payer: Commercial Managed Care - PPO | Source: Ambulatory Visit | Attending: Urology | Admitting: Urology

## 2021-04-27 ENCOUNTER — Other Ambulatory Visit: Payer: Self-pay

## 2021-04-27 DIAGNOSIS — N200 Calculus of kidney: Secondary | ICD-10-CM | POA: Insufficient documentation

## 2021-05-01 ENCOUNTER — Encounter: Payer: Self-pay | Admitting: Urology

## 2021-05-01 ENCOUNTER — Ambulatory Visit: Payer: Commercial Managed Care - PPO | Admitting: Urology

## 2021-05-01 VITALS — BP 105/65 | HR 72

## 2021-05-01 DIAGNOSIS — N200 Calculus of kidney: Secondary | ICD-10-CM

## 2021-05-01 DIAGNOSIS — C61 Malignant neoplasm of prostate: Secondary | ICD-10-CM | POA: Diagnosis not present

## 2021-05-01 DIAGNOSIS — N138 Other obstructive and reflux uropathy: Secondary | ICD-10-CM

## 2021-05-01 DIAGNOSIS — R3912 Poor urinary stream: Secondary | ICD-10-CM

## 2021-05-01 DIAGNOSIS — N401 Enlarged prostate with lower urinary tract symptoms: Secondary | ICD-10-CM | POA: Diagnosis not present

## 2021-05-01 LAB — URINALYSIS, ROUTINE W REFLEX MICROSCOPIC
Bilirubin, UA: NEGATIVE
Glucose, UA: NEGATIVE
Ketones, UA: NEGATIVE
Leukocytes,UA: NEGATIVE
Nitrite, UA: NEGATIVE
Protein,UA: NEGATIVE
RBC, UA: NEGATIVE
Specific Gravity, UA: >1.03 — ABNORMAL HIGH (ref 1.005–1.030)
Urobilinogen, Ur: 0.2 mg/dL (ref 0.2–1.0)
pH, UA: 6 (ref 5.0–7.5)

## 2021-05-01 MED ORDER — TAMSULOSIN HCL 0.4 MG PO CAPS: 0.4000 mg | ORAL_CAPSULE | Freq: Every day | ORAL | 3 refills | Status: DC

## 2021-05-01 NOTE — Patient Instructions (Signed)
Prostate Cancer The prostate is a small gland that produces fluid that makes up semen (seminal fluid). It is located below the bladder in men, in front of the rectum. Prostate cancer is the abnormal growth of cells in the prostate gland. What are the causes? The exact cause of this condition is not known. What increases the risk? You are more likely to develop this condition if: You are 66 years of age or older. You have a family history of prostate cancer. You have a family history of breast and ovarian cancer. You have genes that are passed from parent to child (inherited), such as BRCA1 and BRCA2. You have Lynch syndrome. African American men and men of African descent are diagnosed with prostate cancer at higher rates than other men. The reasons for this are not well understood and are likely due to a combination of genetic and environmental factors. What are the signs or symptoms? Symptoms of this condition include: Problems with urination. This may include: A weak or interrupted flow of urine. Trouble starting or stopping urination. Trouble emptying the bladder all the way. The need to urinate more often, especially at night. Blood in urine or semen. Persistent pain or discomfort in the lower back, lower abdomen, or hips. Trouble getting an erection. Weakness or numbness in the legs or feet. How is this diagnosed? This condition can be diagnosed with: A digital rectal exam. For this exam, a health care provider inserts a gloved finger into the rectum to feel the prostate gland. A blood test called a prostate-specific antigen (PSA) test. A procedure in which a sample of tissue is taken from the prostate and checked under a microscope (prostate biopsy). An imaging test called transrectal ultrasonography. Once the condition is diagnosed, tests will be done to determine how far the cancer has spread. This is called staging the cancer. Staging may involve imaging tests, such as a bone  scan, CT scan, PET scan, or MRI. Stages of prostate cancer The stages of prostate cancer are as follows: Stage 1 (I). At this stage, the cancer is found in the prostate only. The cancer is not visible on imaging tests, and it is usually found by accident, such as during prostate surgery. Stage 2 (II). At this stage, the cancer is more advanced than it is in stage 1, but the cancer has not spread outside the prostate. Stage 3 (III). At this stage, the cancer has spread beyond the outer layer of the prostate to nearby tissues. The cancer may be found in the seminal vesicles, which are near the bladder and the prostate. Stage 4 (IV). At this stage, the cancer has spread to other parts of the body, such as the lymph nodes, bones, bladder, rectum, liver, or lungs. Prostate cancer grading Prostate cancer is also graded according to how the cancer cells look under a microscope. This is called the Gleason score and the total score can range from 6-10, indicating how likely it is that the cancer will spread (metastasize) to other parts of the body. The higher the score, the greater the likelihood that the cancer will spread. Gleason 6 or lower: This indicates that the cancer cells look similar to normal prostate cells (well differentiated). Gleason 7: This indicates that the cancer cells look somewhat similar to normal prostate cells (moderately differentiated). Gleason 8, 9, or 10: This indicates that the cancer cells look very different than normal prostate cells (poorly differentiated). How is this treated? Treatment for this condition depends on several factors,  including the stage of the cancer, your age, personal preferences, and your overall health. Talk with your health care provider about treatment options that are recommended for you. Common treatments include: °Observation for early stage prostate cancer (active surveillance). This involves having exams, blood tests, and in some cases, more biopsies.  For some men, this is the only treatment needed. °Surgery. Types of surgeries include: °Open surgery (radical prostatectomy). In this surgery, a larger incision is made to remove the prostate. °A laparoscopic radical prostatectomy. This is a surgery to remove the prostate and lymph nodes through several small incisions. It is often referred to as a minimally invasive surgery. °A robotic radical prostatectomy. This is laparoscopic surgery to remove the prostate and lymph nodes with the help of robotic arms that are controlled by the surgeon. °Cryoablation. This is surgery to freeze and destroy cancer cells. °Radiation treatment. Types of radiation treatment include: °External beam radiation. This type aims beams of radiation from outside the body at the prostate to destroy cancerous cells. °Brachytherapy. This type uses radioactive needles, seeds, wires, or tubes that are implanted into the prostate gland. Like external beam radiation, brachytherapy destroys cancerous cells. An advantage is that this type of radiation limits the damage to surrounding tissue and has fewer side effects. °Chemotherapy. This treatment kills cancer cells or stops them from multiplying. It kills both cancer cells and normal cells. °Targeted therapy. This treatment uses medicines to kill cancer cells without damaging normal cells. °Hormone treatment. This treatment involves taking medicines that act on testosterone, one of the male hormones, by: °Stopping your body from producing testosterone. °Blocking testosterone from reaching cancer cells. °Follow these instructions at home: °Lifestyle °Do not use any products that contain nicotine or tobacco. These products include cigarettes, chewing tobacco, and vaping devices, such as e-cigarettes. If you need help quitting, ask your health care provider. °Eat a healthy diet. To do this: °Eat foods that are high in fiber. These include beans, whole grains, and fresh fruits and vegetables. °Limit  foods that are high in fat and sugar. These include fried or sweet foods. °Treatment for prostate cancer may affect sexual function. If you have a partner, continue to have intimate moments. This may include touching, holding, hugging, and caressing your partner. °Get plenty of sleep. °Consider joining a support group for men who have prostate cancer. Meeting with a support group may help you learn to manage the stress of having cancer. °General instructions °Take over-the-counter and prescription medicines only as told by your health care provider. °If you have to go to the hospital, notify your cancer specialist (oncologist). °Keep all follow-up visits. This is important. °Where to find more information °American Cancer Society: www.cancer.org °American Society of Clinical Oncology: www.cancer.net °National Cancer Institute: www.cancer.gov °Contact a health care provider if: °You have new or increasing trouble urinating. °You have new or increasing blood in your urine. °You have new or increasing pain in your hips, back, or chest. °Get help right away if: °You have weakness or numbness in your legs. °You cannot control urination or your bowel movements (incontinence). °You have chills or a fever. °Summary °The prostate is a small gland that is involved in the production of semen. It is located below a man's bladder, in front of the rectum. °Prostate cancer is the abnormal growth of cells in the prostate gland. °Treatment for this condition depends on the stage of the cancer, your age, personal preferences, and your overall health. Talk with your health care provider about treatment   options that are recommended for you. °Consider joining a support group for men who have prostate cancer. Meeting with a support group may help you learn to manage the stress of having cancer. °This information is not intended to replace advice given to you by your health care provider. Make sure you discuss any questions you have with  your health care provider. °Document Revised: 06/01/2020 Document Reviewed: 06/01/2020 °Elsevier Patient Education © 2022 [employer] ° °

## 2021-05-01 NOTE — Progress Notes (Signed)
05/01/2021 2:30 PM   Luis Griffin 1955/09/26 [id number]  Referring provider: Curlene Labrum, MD [place],  [place] [address]  Followup BPH, prostate cancer and nephrolithiasis   HPI: Mr Hofmann is a 66yo here for followup for BPH, prostate cancer, and nephrolithiasis. PSA undetectable. KUb from 2/9 shows no renal calculi. IPSS 5 QOL 1 on flomax 0.4mg  daily. Urine stream good. Nocturia 1x.  He was diagnosed with lung cancer in 10/2020 and he finished chemotherapy 1 month ago.  No other complaints today.    PMH: Past Medical History:  Diagnosis Date   Coronary artery disease    cardiologsit-  dr Dellis Filbert clevenger   Depression    Frequency of urination    History of MI (myocardial infarction)    Hyperlipidemia    Hypertension    Lung cancer ([place])    OA (osteoarthritis)    Productive cough    Prostate cancer ([place]) UROLOGIST-  DR Melizza Kanode/  ONCOLOGIST-  DR MANNING   dx 02/ 2018,  Stage T1c,  PSA 5.0,  Gleason 4+3, vol 45cc   S/P drug eluting coronary stent placement    Smokers' cough ([place])    Systemic lupus erythematosus ([place])    Urgency of urination    Wears hearing aid in both ears     Surgical History: Past Surgical History:  Procedure Laterality Date   CARDIOVASCULAR STRESS TEST  06-03-2007   dr clevenger ([facility])   moderate sized fixed defect in the inferolateral wall probable scar (poss. attenuation artifact),  small area peri-infarct ischemia in the inferolateral region,  segment of inferolateral is akinetic , septal motion consistant w/ post-op changes,  ef 65%,  no evidence significant ischemia   CORONARY ANGIOPLASTY WITH STENT PLACEMENT  2007  at [facility]   x2 DES per pt   CORONARY ARTERY BYPASS GRAFT  2006   [place]   x4 per pt   DOBUTAMINE STRESS ECHO  09/14/2015   dr j. cleveger ([facility])   normal stress echo-- moderate LVH, ef 55-60%, mild inferior wall hypokinesis,  mild AV sclerosis without stenosis,     LUNG CANCER  SURGERY Right    PROSTATE BIOPSY  05/02/2016   RADIOACTIVE SEED IMPLANT N/A 08/17/2016   Procedure: RADIOACTIVE SEED IMPLANT/BRACHYTHERAPY IMPLANT;  Surgeon: Cleon Gustin, MD;  Location: [facility];  Service: Urology;  Laterality: N/A;   TONSILLECTOMY  child   WRIST GANGLION EXCISION Right 2015    Home Medications:  Allergies as of 05/01/2021       Reactions   Varenicline Other (See Comments)   Other reaction(s): Constipation Other reaction(s): Constipation Other reaction(s): Constipation        Medication List        Accurate as of May 01, 2021  2:30 PM. If you have any questions, ask your nurse or doctor.          aspirin 325 MG EC tablet Take by mouth.   atenolol 25 MG tablet Commonly known as: TENORMIN Take 25 mg by mouth 2 (two) times daily.   atorvastatin 40 MG tablet Commonly known as: LIPITOR Take 40 mg by mouth daily.   clopidogrel 75 MG tablet Commonly known as: PLAVIX   dexamethasone 4 MG tablet Commonly known as: DECADRON Take by mouth.   diclofenac Sodium 1 % Gel Commonly known as: VOLTAREN Place onto the skin.   ezetimibe 10 MG tablet Commonly known as: ZETIA Take 10 mg by mouth every evening.  folic acid 244 MCG tablet Commonly known as: FOLVITE Take by mouth.   lisinopril 10 MG tablet Commonly known as: ZESTRIL Take 20 mg by mouth every evening.   lisinopril 20 MG tablet Commonly known as: ZESTRIL Take 20 mg by mouth daily.   nitroGLYCERIN 0.4 MG SL tablet Commonly known as: NITROSTAT Place under the tongue.   ondansetron 8 MG tablet Commonly known as: ZOFRAN Take by mouth.   oxyCODONE 5 MG immediate release tablet Commonly known as: Oxy IR/ROXICODONE Take by mouth.   pregabalin 50 MG capsule Commonly known as: LYRICA Take 50 mg by mouth 3 (three) times daily.   tamsulosin 0.4 MG Caps capsule Commonly known as: FLOMAX Take 1 capsule (0.4 mg total) by mouth daily after supper.    tobramycin-dexamethasone ophthalmic solution Commonly known as: TOBRADEX SMARTSIG:In Eye(s)        Allergies:  Allergies  Allergen Reactions   Varenicline Other (See Comments)    Other reaction(s): Constipation Other reaction(s): Constipation Other reaction(s): Constipation    Family History: Family History  Problem Relation Age of Onset   Cancer Father        prostate ca   Kidney cancer Father    Heart disease Mother    Lupus Mother    Thyroid disease Mother    Rheum arthritis Maternal Aunt    Lupus Maternal Grandfather     Social History:  reports that he quit smoking about 6 months ago. His smoking use included cigarettes. He has a 45.00 pack-year smoking history. He quit smokeless tobacco use about 7 months ago.  His smokeless tobacco use included snuff and chew. He reports current alcohol use. He reports that he does not use drugs.  ROS: All other review of systems were reviewed and are negative except what is noted above in HPI  Physical Exam: BP 105/65    Pulse 72   Constitutional:  Alert and oriented, No acute distress. HEENT: [place] AT, moist mucus membranes.  Trachea midline, no masses. Cardiovascular: No clubbing, cyanosis, or edema. Respiratory: Normal respiratory effort, no increased work of breathing. GI: Abdomen is soft, nontender, nondistended, no abdominal masses GU: No CVA tenderness.  Lymph: No cervical or inguinal lymphadenopathy. Skin: No rashes, bruises or suspicious lesions. Neurologic: Grossly intact, no focal deficits, moving all 4 extremities. Psychiatric: Normal mood and affect.  Laboratory Data: Lab Results  Component Value Date   WBC 9.6 08/10/2016   HGB 18.1 (H) 08/10/2016   HCT 51.2 08/10/2016   MCV 88.3 08/10/2016   PLT 219 08/10/2016    Lab Results  Component Value Date   CREATININE 0.87 08/10/2016    No results found for: PSA  No results found for: TESTOSTERONE  No results found for: HGBA1C  Urinalysis    Component  Value Date/Time   COLORURINE YELLOW 04/11/2016 1600   APPEARANCEUR Clear 04/27/2020 1505   LABSPEC 1.010 09/07/2016 1116   PHURINE 6.0 09/07/2016 1116   PHURINE 5.0 04/11/2016 1600   GLUCOSEU Negative 04/27/2020 1505   GLUCOSEU Negative 09/07/2016 1116   HGBUR Small 09/07/2016 1116   HGBUR MODERATE (A) 04/11/2016 1600   BILIRUBINUR Negative 04/27/2020 1505   BILIRUBINUR Negative 09/07/2016 1116   KETONESUR Negative 09/07/2016 1116   KETONESUR NEGATIVE 04/11/2016 1600   PROTEINUR Trace (A) 04/27/2020 1505   PROTEINUR Negative 09/07/2016 1116   PROTEINUR NEGATIVE 04/11/2016 1600   UROBILINOGEN 0.2 09/07/2016 1116   NITRITE Negative 04/27/2020 1505   NITRITE Negative 09/07/2016 1116   NITRITE NEGATIVE 04/11/2016 1600  LEUKOCYTESUR Negative 04/27/2020 1505   LEUKOCYTESUR Negative 09/07/2016 1116    Lab Results  Component Value Date   LABMICR See below: 04/27/2020   WBCUA None seen 04/27/2020   LABEPIT 0-10 04/27/2020   MUCUS Present 04/27/2020   BACTERIA None seen 04/27/2020    Pertinent Imaging: KUB 04/27/2021: Images reviewed and discussed with the patient  Results for orders placed during the hospital encounter of 04/27/21  Abdomen 1 view (KUB)  Narrative CLINICAL DATA:  Nephrolithiasis  EXAM: ABDOMEN - 1 VIEW  COMPARISON:  PET-CT 05/16/2020  FINDINGS: left upper pole calculus seen on prior PET-CT is not conspicuous.The bowel gas pattern is normal. Bilateral pelvic phleboliths. Metallic seeds in the region of the prostate, and left pelvis. Regional bones unremarkable.  IMPRESSION: Normal bowel gas pattern   Electronically Signed By: Lucrezia Europe M.D. On: 04/29/2021 11:27  No results found for this or any previous visit.  No results found for this or any previous visit.  No results found for this or any previous visit.  Results for orders placed during the hospital encounter of 09/18/17  US RENAL  Narrative CLINICAL DATA:  Follow-up examination for  right-sided ureteral calculus.  EXAM: RENAL / URINARY TRACT ULTRASOUND COMPLETE  COMPARISON:  Prior CT from 05/23/2016.  FINDINGS: Right Kidney:  Length: 15.6 cm. Echogenicity within normal limits. No hydronephrosis. 1.0 x 0.9 x 1.0 cm minimally complex cyst present within the interpolar right kidney grossly similar to previous. No shadowing echogenic calculi identified.  Left Kidney:  Length: 13.9 cm. Echogenicity within normal limits. No mass or hydronephrosis visualized. No shadowing echogenic calculi identified.  Bladder:  Appears normal for degree of bladder distention. Bilateral ureteral jets identified.  IMPRESSION: 1. No hydronephrosis.  No sonographic evidence for nephrolithiasis. 2. 1 cm minimally complex right renal cyst. Finding is almost certainly benign, and grossly similar as compared to prior CT from 2018.   Electronically Signed By: Jeannine Boga M.D. On: 09/18/2017 17:45  No results found for this or any previous visit.  No results found for this or any previous visit.  No results found for this or any previous visit.   Assessment & Plan:    1. Weak urinary stream -Continue flomax 0.4mg  daily - Urinalysis, Routine w reflex microscopic  2. Prostate cancer ([place]) -RTC 1 year with PSA  3. Benign prostatic hyperplasia with urinary obstruction -Continue flomax 0.4mg  daily  4. Nephrolithiasis -RTc 1 year with KUB   No follow-ups on file.  Nicolette Bang, MD  [facility] [place]

## 2021-11-10 ENCOUNTER — Other Ambulatory Visit: Payer: Self-pay | Admitting: Urology

## 2021-11-10 DIAGNOSIS — R3912 Poor urinary stream: Secondary | ICD-10-CM

## 2022-03-31 ENCOUNTER — Other Ambulatory Visit: Payer: Self-pay | Admitting: Urology

## 2022-03-31 DIAGNOSIS — R3912 Poor urinary stream: Secondary | ICD-10-CM

## 2022-04-16 ENCOUNTER — Telehealth: Payer: Self-pay

## 2022-04-16 NOTE — Telephone Encounter (Signed)
Patient left a voice message 04-16-22.  Wants to know if his Dec labs from PCP will be what Dr. Alyson Ingles, will need for 1 yr f/u?  Please advise.

## 2022-04-16 NOTE — Telephone Encounter (Signed)
Patient called and made aware that Dec PSA was ok and to get his KUB before his appointment. Patient voiced understanding.

## 2022-04-17 ENCOUNTER — Other Ambulatory Visit: Payer: Commercial Managed Care - PPO

## 2022-04-17 ENCOUNTER — Ambulatory Visit (HOSPITAL_COMMUNITY)
Admission: RE | Admit: 2022-04-17 | Discharge: 2022-04-17 | Disposition: A | Discharge status: Home / Self Care | Payer: 59 | Source: Ambulatory Visit | Attending: Urology | Admitting: Urology

## 2022-04-17 DIAGNOSIS — N200 Calculus of kidney: Secondary | ICD-10-CM | POA: Diagnosis present

## 2022-04-24 ENCOUNTER — Ambulatory Visit: Payer: Commercial Managed Care - PPO | Admitting: Urology

## 2022-04-30 ENCOUNTER — Encounter: Payer: Self-pay | Admitting: Urology

## 2022-04-30 ENCOUNTER — Ambulatory Visit: Payer: 59 | Admitting: Urology

## 2022-04-30 VITALS — BP 111/72 | HR 53

## 2022-04-30 DIAGNOSIS — C61 Malignant neoplasm of prostate: Secondary | ICD-10-CM

## 2022-04-30 DIAGNOSIS — N200 Calculus of kidney: Secondary | ICD-10-CM

## 2022-04-30 DIAGNOSIS — N138 Other obstructive and reflux uropathy: Secondary | ICD-10-CM

## 2022-04-30 DIAGNOSIS — R3912 Poor urinary stream: Secondary | ICD-10-CM | POA: Diagnosis not present

## 2022-04-30 DIAGNOSIS — N401 Enlarged prostate with lower urinary tract symptoms: Secondary | ICD-10-CM

## 2022-04-30 LAB — URINALYSIS, ROUTINE W REFLEX MICROSCOPIC
Bilirubin, UA: NEGATIVE
Glucose, UA: NEGATIVE
Ketones, UA: NEGATIVE
Leukocytes,UA: NEGATIVE
Nitrite, UA: NEGATIVE
Protein,UA: NEGATIVE
RBC, UA: NEGATIVE
Specific Gravity, UA: 1.015 (ref 1.005–1.030)
Urobilinogen, Ur: 0.2 mg/dL (ref 0.2–1.0)
pH, UA: 5.5 (ref 5.0–7.5)

## 2022-04-30 MED ORDER — TAMSULOSIN HCL 0.4 MG PO CAPS: 0.4000 mg | ORAL_CAPSULE | Freq: Every day | ORAL | 3 refills | Status: DC

## 2022-04-30 NOTE — Progress Notes (Unsigned)
04/30/2022 11:51 AM   Luis Griffin February 02, 1956 [id number]  Referring provider: Curlene Labrum, MD [place],  [place] [address]  No chief complaint on file.   HPI: PSA 0.04 in 02/2022. KUB from 04/18/2022 shows 35mm left lower pole calculus. IPSS 5 QOL 1 on flomax 0.4mg     PMH: Past Medical History:  Diagnosis Date   Coronary artery disease    cardiologsit-  dr Dellis Filbert clevenger   Depression    Frequency of urination    History of MI (myocardial infarction)    Hyperlipidemia    Hypertension    Lung cancer ([place])    OA (osteoarthritis)    Productive cough    Prostate cancer ([place]) UROLOGIST-  DR Demaurion Dicioccio/  ONCOLOGIST-  DR MANNING   dx 02/ 2018,  Stage T1c,  PSA 5.0,  Gleason 4+3, vol 45cc   S/P drug eluting coronary stent placement    Smokers' cough ([place])    Systemic lupus erythematosus ([place])    Urgency of urination    Wears hearing aid in both ears     Surgical History: Past Surgical History:  Procedure Laterality Date   CARDIOVASCULAR STRESS TEST  06-03-2007   dr clevenger ([facility])   moderate sized fixed defect in the inferolateral wall probable scar (poss. attenuation artifact),  small area peri-infarct ischemia in the inferolateral region,  segment of inferolateral is akinetic , septal motion consistant w/ post-op changes,  ef 65%,  no evidence significant ischemia   CORONARY ANGIOPLASTY WITH STENT PLACEMENT  2007  at [facility]   x2 DES per pt   CORONARY ARTERY BYPASS GRAFT  2006   [place]   x4 per pt   DOBUTAMINE STRESS ECHO  09/14/2015   dr j. cleveger ([facility])   normal stress echo-- moderate LVH, ef 55-60%, mild inferior wall hypokinesis,  mild AV sclerosis without stenosis,     LUNG CANCER SURGERY Right    PROSTATE BIOPSY  05/02/2016   RADIOACTIVE SEED IMPLANT N/A 08/17/2016   Procedure: RADIOACTIVE SEED IMPLANT/BRACHYTHERAPY IMPLANT;  Surgeon: Cleon Gustin, MD;  Location: [facility];  Service: Urology;   Laterality: N/A;   TONSILLECTOMY  child   WRIST GANGLION EXCISION Right 2015    Home Medications:  Allergies as of 04/30/2022       Reactions   Varenicline Other (See Comments)   Other reaction(s): Constipation Other reaction(s): Constipation Other reaction(s): Constipation        Medication List        Accurate as of April 30, 2022 11:51 AM. If you have any questions, ask your nurse or doctor.          aspirin EC 325 MG tablet Take by mouth.   atenolol 25 MG tablet Commonly known as: TENORMIN Take 25 mg by mouth 2 (two) times daily.   atorvastatin 80 MG tablet Commonly known as: LIPITOR Take 80 mg by mouth daily.   clopidogrel 75 MG tablet Commonly known as: PLAVIX   dexamethasone 4 MG tablet Commonly known as: DECADRON Take by mouth.   diclofenac Sodium 1 % Gel Commonly known as: VOLTAREN Place onto the skin.   ezetimibe 10 MG tablet Commonly known as: ZETIA Take 10 mg by mouth every evening.   folic acid 751 MCG tablet Commonly known as: FOLVITE Take by mouth.   lisinopril 10 MG tablet Commonly known as: ZESTRIL Take 20 mg by mouth every evening.   lisinopril 20 MG tablet Commonly known  as: ZESTRIL Take 20 mg by mouth daily.   metFORMIN 500 MG tablet Commonly known as: GLUCOPHAGE Take 500 mg by mouth 2 (two) times daily.   nitroGLYCERIN 0.4 MG SL tablet Commonly known as: NITROSTAT Place under the tongue.   ondansetron 8 MG tablet Commonly known as: ZOFRAN Take by mouth.   oxyCODONE 5 MG immediate release tablet Commonly known as: Oxy IR/ROXICODONE Take by mouth.   pregabalin 50 MG capsule Commonly known as: LYRICA Take 50 mg by mouth 3 (three) times daily.   tamsulosin 0.4 MG Caps capsule Commonly known as: FLOMAX TAKE 1 CAPSULE (0.4 MG TOTAL) BY MOUTH DAILY AFTER SUPPER.   tobramycin-dexamethasone ophthalmic solution Commonly known as: TOBRADEX SMARTSIG:In Eye(s)        Allergies:  Allergies  Allergen Reactions    Varenicline Other (See Comments)    Other reaction(s): Constipation Other reaction(s): Constipation Other reaction(s): Constipation    Family History: Family History  Problem Relation Age of Onset   Cancer Father        prostate ca   Kidney cancer Father    Heart disease Mother    Lupus Mother    Thyroid disease Mother    Rheum arthritis Maternal Aunt    Lupus Maternal Grandfather     Social History:  reports that he quit smoking about 18 months ago. His smoking use included cigarettes. He has a 45.00 pack-year smoking history. He quit smokeless tobacco use about 19 months ago.  His smokeless tobacco use included snuff and chew. He reports current alcohol use. He reports that he does not use drugs.  ROS: All other review of systems were reviewed and are negative except what is noted above in HPI  Physical Exam: BP 111/72   Pulse (!) 53   Constitutional:  Alert and oriented, No acute distress. HEENT: [place] AT, moist mucus membranes.  Trachea midline, no masses. Cardiovascular: No clubbing, cyanosis, or edema. Respiratory: Normal respiratory effort, no increased work of breathing. GI: Abdomen is soft, nontender, nondistended, no abdominal masses GU: No CVA tenderness.  Lymph: No cervical or inguinal lymphadenopathy. Skin: No rashes, bruises or suspicious lesions. Neurologic: Grossly intact, no focal deficits, moving all 4 extremities. Psychiatric: Normal mood and affect.  Laboratory Data: Lab Results  Component Value Date   WBC 9.6 08/10/2016   HGB 18.1 (H) 08/10/2016   HCT 51.2 08/10/2016   MCV 88.3 08/10/2016   PLT 219 08/10/2016    Lab Results  Component Value Date   CREATININE 0.87 08/10/2016    No results found for: "PSA"  No results found for: "TESTOSTERONE"  No results found for: "HGBA1C"  Urinalysis    Component Value Date/Time   COLORURINE YELLOW 04/11/2016 1600   APPEARANCEUR Clear 05/01/2021 1353   LABSPEC 1.010 09/07/2016 1116   PHURINE 6.0  09/07/2016 1116   PHURINE 5.0 04/11/2016 1600   GLUCOSEU Negative 05/01/2021 1353   GLUCOSEU Negative 09/07/2016 1116   HGBUR Small 09/07/2016 1116   HGBUR MODERATE (A) 04/11/2016 1600   BILIRUBINUR Negative 05/01/2021 1353   BILIRUBINUR Negative 09/07/2016 1116   KETONESUR Negative 09/07/2016 1116   KETONESUR NEGATIVE 04/11/2016 1600   PROTEINUR Negative 05/01/2021 1353   PROTEINUR Negative 09/07/2016 1116   PROTEINUR NEGATIVE 04/11/2016 1600   UROBILINOGEN 0.2 09/07/2016 1116   NITRITE Negative 05/01/2021 1353   NITRITE Negative 09/07/2016 1116   NITRITE NEGATIVE 04/11/2016 1600   LEUKOCYTESUR Negative 05/01/2021 1353   LEUKOCYTESUR Negative 09/07/2016 1116    Lab Results  Component  Value Date   LABMICR Comment 05/01/2021   WBCUA None seen 04/27/2020   LABEPIT 0-10 04/27/2020   MUCUS Present 04/27/2020   BACTERIA None seen 04/27/2020    Pertinent Imaging: *** Results for orders placed during the hospital encounter of 04/17/22  Abdomen 1 view (KUB)  Narrative CLINICAL DATA:  Nephrolithiasis  EXAM: ABDOMEN - 1 VIEW  COMPARISON:  04/27/2021  FINDINGS: Nonobstructive bowel-gas pattern. Moderate diffuse colonic stool burden. 4 mm stone in the upper pole of the left kidney. Phleboliths. Metallic seeds in the prostate.  IMPRESSION: 4 mm nonobstructing left upper pole renal stone.   Electronically Signed By: Placido Sou M.D. On: 04/18/2022 02:55  No results found for this or any previous visit.  No results found for this or any previous visit.  No results found for this or any previous visit.  Results for orders placed during the hospital encounter of 09/18/17  US RENAL  Narrative CLINICAL DATA:  Follow-up examination for right-sided ureteral calculus.  EXAM: RENAL / URINARY TRACT ULTRASOUND COMPLETE  COMPARISON:  Prior CT from 05/23/2016.  FINDINGS: Right Kidney:  Length: 15.6 cm. Echogenicity within normal limits. No hydronephrosis. 1.0  x 0.9 x 1.0 cm minimally complex cyst present within the interpolar right kidney grossly similar to previous. No shadowing echogenic calculi identified.  Left Kidney:  Length: 13.9 cm. Echogenicity within normal limits. No mass or hydronephrosis visualized. No shadowing echogenic calculi identified.  Bladder:  Appears normal for degree of bladder distention. Bilateral ureteral jets identified.  IMPRESSION: 1. No hydronephrosis.  No sonographic evidence for nephrolithiasis. 2. 1 cm minimally complex right renal cyst. Finding is almost certainly benign, and grossly similar as compared to prior CT from 2018.   Electronically Signed By: Jeannine Boga M.D. On: 09/18/2017 17:45  No valid procedures specified. No results found for this or any previous visit.  No results found for this or any previous visit.   Assessment & Plan:    1. Nephrolithiasis *** - Urinalysis, Routine w reflex microscopic  2. Prostate cancer ([place]) *** - Urinalysis, Routine w reflex microscopic  3. Benign prostatic hyperplasia with urinary obstruction ***  4. Weak urinary stream ***   No follow-ups on file.  Nicolette Bang, MD  [facility] [place]

## 2022-04-30 NOTE — Patient Instructions (Signed)

## 2022-06-26 ENCOUNTER — Emergency Department (HOSPITAL_COMMUNITY): Payer: 59

## 2022-06-26 ENCOUNTER — Other Ambulatory Visit: Payer: Self-pay

## 2022-06-26 ENCOUNTER — Encounter (HOSPITAL_COMMUNITY): Payer: Self-pay

## 2022-06-26 ENCOUNTER — Emergency Department (HOSPITAL_COMMUNITY)
Admission: EM | Admit: 2022-06-26 | Discharge: 2022-06-27 | Disposition: A | Discharge status: Home / Self Care | Payer: 59 | Attending: Emergency Medicine | Admitting: Emergency Medicine

## 2022-06-26 DIAGNOSIS — R079 Chest pain, unspecified: Secondary | ICD-10-CM | POA: Insufficient documentation

## 2022-06-26 DIAGNOSIS — I2510 Atherosclerotic heart disease of native coronary artery without angina pectoris: Secondary | ICD-10-CM | POA: Diagnosis not present

## 2022-06-26 DIAGNOSIS — Z951 Presence of aortocoronary bypass graft: Secondary | ICD-10-CM | POA: Diagnosis not present

## 2022-06-26 DIAGNOSIS — Z7982 Long term (current) use of aspirin: Secondary | ICD-10-CM | POA: Diagnosis not present

## 2022-06-26 DIAGNOSIS — Z7901 Long term (current) use of anticoagulants: Secondary | ICD-10-CM | POA: Insufficient documentation

## 2022-06-26 LAB — CBC
HCT: 49.6 % (ref 39.0–52.0)
Hemoglobin: 17.1 g/dL — ABNORMAL HIGH (ref 13.0–17.0)
MCH: 30.9 pg (ref 26.0–34.0)
MCHC: 34.5 g/dL (ref 30.0–36.0)
MCV: 89.5 fL (ref 80.0–100.0)
Platelets: 205 10*3/uL (ref 150–400)
RBC: 5.54 MIL/uL (ref 4.22–5.81)
RDW: 13.2 % (ref 11.5–15.5)
WBC: 7.7 10*3/uL (ref 4.0–10.5)
nRBC: 0 % (ref 0.0–0.2)

## 2022-06-26 LAB — TROPONIN I (HIGH SENSITIVITY)
Troponin I (High Sensitivity): 3 ng/L (ref <18)
Troponin I (High Sensitivity): 4 ng/L (ref <18)

## 2022-06-26 LAB — BASIC METABOLIC PANEL
Anion gap: 6 (ref 5–15)
BUN: 25 mg/dL — ABNORMAL HIGH (ref 8–23)
CO2: 25 mmol/L (ref 22–32)
Calcium: 9.7 mg/dL (ref 8.9–10.3)
Chloride: 107 mmol/L (ref 98–111)
Creatinine, Ser: 0.93 mg/dL (ref 0.61–1.24)
GFR, Estimated: >60 mL/min (ref >60)
Glucose, Bld: 117 mg/dL — ABNORMAL HIGH (ref 70–99)
Potassium: 4 mmol/L (ref 3.5–5.1)
Sodium: 138 mmol/L (ref 135–145)

## 2022-06-26 LAB — LIPASE, BLOOD: Lipase: 32 U/L (ref 11–51)

## 2022-06-26 LAB — HEPATIC FUNCTION PANEL
ALT: 34 U/L (ref 0–44)
AST: 21 U/L (ref 15–41)
Albumin: 4.4 g/dL (ref 3.5–5.0)
Alkaline Phosphatase: 73 U/L (ref 38–126)
Bilirubin, Direct: 0.1 mg/dL (ref 0.0–0.2)
Indirect Bilirubin: 0.8 mg/dL (ref 0.3–0.9)
Total Bilirubin: 0.9 mg/dL (ref 0.3–1.2)
Total Protein: 7.1 g/dL (ref 6.5–8.1)

## 2022-06-26 LAB — MAGNESIUM: Magnesium: 2 mg/dL (ref 1.7–2.4)

## 2022-06-26 MED ORDER — IOHEXOL 350 MG/ML SOLN
75.0000 mL | Freq: Once | INTRAVENOUS | Status: AC | PRN
  Administered 2022-06-26: 75 mL via INTRAVENOUS

## 2022-06-26 NOTE — ED Provider Notes (Signed)
[place] [facility]MENT AT [facility] Provider Note   CSN: [id number] Arrival date & time: 06/26/22  1810     History  Chief Complaint  Patient presents with   Chest Pain   HPI Luis Griffin is a 67 y.o. male with CAD status post CABG in 2006 and coronary angioplasty with stent 2007 presenting for chest pain.  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  It came on all of a sudden and felt like he was "punched in the chest".  Pain radiated to the back and right shoulder blade.  The pain from the shoulder blade feels more like someone is tearing itch shoulder blade away from his body.  States initially the pain "took his breath away" but denies shortness of breath.  States right now he is not having chest pain but does feel the pain in his shoulder blade.  States that his symptoms today are similar to when he had his heart attack 20 years ago.  Concerned this may be heart related.   Chest Pain      Home Medications Prior to Admission medications   Medication Sig Start Date End Date Taking? Authorizing Provider  aspirin 325 MG EC tablet Take by mouth. 09/14/11  Yes [provider]  atenolol (TENORMIN) 25 MG tablet Take 25 mg by mouth 2 (two) times daily.    Yes [provider]  atorvastatin (LIPITOR) 80 MG tablet Take 80 mg by mouth daily. 07/28/19  Yes [provider]  clopidogrel (PLAVIX) 75 MG tablet Take 75 mg by mouth daily. 06/22/16  Yes [provider]  diclofenac Sodium (VOLTAREN) 1 % GEL Place onto the skin.   Yes [provider]  ezetimibe (ZETIA) 10 MG tablet Take 10 mg by mouth every evening.    Yes [provider]  lisinopril (ZESTRIL) 20 MG tablet Take 20 mg by mouth daily. 12/25/20  Yes [provider]  metFORMIN (GLUCOPHAGE) 500 MG tablet Take 500 mg by mouth 2 (two) times daily.   Yes [provider]  nitroGLYCERIN (NITROSTAT) 0.4 MG SL tablet Place under the  tongue. 11/14/11  Yes [provider]  tamsulosin (FLOMAX) 0.4 MG CAPS capsule Take 1 capsule (0.4 mg total) by mouth daily after supper. 04/30/22  Yes McKenzie, Mardene Celeste, MD      Allergies    Varenicline    Review of Systems   Review of Systems  Cardiovascular:  Positive for chest pain.    Physical Exam   Vitals:   06/26/22 1821 06/26/22 2000  BP: 138/89 118/78  Pulse: 69 60  Resp: 20 17  Temp: 98.1 F (36.7 C)   SpO2: 96% 97%    CONSTITUTIONAL:  well-appearing, NAD NEURO:  Alert and oriented x 3, CN 3-12 grossly intact EYES:  eyes equal and reactive ENT/NECK:  Supple, no stridor  CARDIO:  regular rate and rhythm, appears well-perfused  PULM:  No respiratory distress, CTAB GI/GU:  non-distended, soft MSK/SPINE:  No gross deformities, no edema, moves all extremities  SKIN:  no rash, atraumatic   *Additional and/or pertinent findings included in MDM below    ED Results / Procedures / Treatments   Labs (all labs ordered are listed, but only abnormal results are displayed) Labs Reviewed  BASIC METABOLIC PANEL - Abnormal; Notable for the following components:      Result Value   Glucose, Bld 117 (*)    BUN 25 (*)    All other components  within normal limits  CBC - Abnormal; Notable for the following components:   Hemoglobin 17.1 (*)    All other components within normal limits  LIPASE, BLOOD  HEPATIC FUNCTION PANEL  MAGNESIUM  I-STAT CHEM 8, ED  TROPONIN I (HIGH SENSITIVITY)  TROPONIN I (HIGH SENSITIVITY)    EKG EKG Interpretation  Date/Time:  Tuesday June 26 2022 18:19:34 EDT Ventricular Rate:  77 PR Interval:  152 QRS Duration: 106 QT Interval:  386 QTC Calculation: 436 R Axis:   69 Text Interpretation: Normal sinus rhythm Left ventricular hypertrophy with repolarization abnormality ( R in aVL ) Confirmed by Gloris Manchesterixon, Ryan [id number]) on 06/26/2022 6:51:41 PM  Radiology CT Angio Chest PE W/Cm &/Or Wo Cm  Result Date: 06/26/2022 CLINICAL DATA:  High  probability for PE.  Chest pain. EXAM: CT ANGIOGRAPHY CHEST WITH CONTRAST TECHNIQUE: Multidetector CT imaging of the chest was performed using the standard protocol during bolus administration of intravenous contrast. Multiplanar CT image reconstructions and MIPs were obtained to evaluate the vascular anatomy. RADIATION DOSE REDUCTION: This exam was performed according to the departmental dose-optimization program which includes automated exposure control, adjustment of the mA and/or kV according to patient size and/or use of iterative reconstruction technique. CONTRAST:  75mL OMNIPAQUE IOHEXOL 350 MG/ML SOLN COMPARISON:  PET-CT 05/08/2020. CT of the chest 01/05/2022. CT chest 03/17/2019. FINDINGS: Cardiovascular: Satisfactory opacification of the pulmonary arteries to the segmental level. No evidence of pulmonary embolism. Normal heart size. No pericardial effusion. There are atherosclerotic calcifications of the aorta. Patient is status post cardiac surgery. Mediastinum/Nodes: There is a mildly enlarged prevascular lymph node measuring 1 cm short axis. There are additional prominent, but nonenlarged mediastinal and hilar lymph nodes. Visualized esophagus and thyroid gland are within normal limits. Lungs/Pleura: Mild emphysematous changes are present. There is a 6 mm left lower lobe pulmonary nodule image 7/86 which is unchanged. There is a 1 cm left upper lobe pulmonary nodule image 7/60 which is unchanged. There stable postsurgical changes from right lower lobectomy. These are unchanged from 2020 and favored as benign. No new lung infiltrate, pleural effusion or pneumothorax. Upper Abdomen: No acute abnormality. Musculoskeletal: No acute osseous findings. Median sternotomy wires are present. Review of the MIP images confirms the above findings. IMPRESSION: 1. No evidence for pulmonary embolism. 2. No acute cardiopulmonary process. 3. Stable mildly enlarged prevascular lymph node. Aortic Atherosclerosis  (ICD10-I70.0) and Emphysema (ICD10-J43.9). Electronically Signed   By: Darliss CheneyAmy  Guttmann M.D.   On: 06/26/2022 23:04   DG Chest 2 View  Result Date: 06/26/2022 CLINICAL DATA:  Chest pain EXAM: CHEST - 2 VIEW COMPARISON:  Chest x-ray 07/05/2016 FINDINGS: There is minimal bibasilar atelectasis. There is no focal lung consolidation. There is a small right pleural effusion. The cardiomediastinal silhouette is within normal limits. No pneumothorax or acute fracture. Patient is status post cardiac surgery. IMPRESSION: Small right pleural effusion. Minimal bibasilar atelectasis. Electronically Signed   By: Darliss CheneyAmy  Guttmann M.D.   On: 06/26/2022 19:06    Procedures Procedures    Medications Ordered in ED Medications  iohexol (OMNIPAQUE) 350 MG/ML injection 75 mL (75 mLs Intravenous Contrast Given 06/26/22 2234)    ED Course/ Medical Decision Making/ A&P                             Medical Decision Making Amount and/or Complexity of Data Reviewed Labs: ordered. Radiology: ordered.  Risk Prescription drug management.   Initial Impression and Ddx 67 year old male  who is well-appearing presenting for chest pain.  Exam was unremarkable.  DDx includes ACS, PE, pneumonia, pneumothorax, CHF exacerbation. Patient  PMH that increases complexity of ED encounter:  CAD status post CABG in 2006 and coronary angioplasty with stent 2007  Interpretation of Diagnostics I independent reviewed and interpreted the labs as followed: No acute derangement  - I independently visualized the following imaging with scope of interpretation limited to determining acute life threatening conditions related to emergency care: CT angio of the chest, which revealed no acute process  I personally reviewed and interpreted EKG which revealed normal sinus rhythm. Patient Reassessment and Ultimate Disposition/Management Given CAD history, initial concern was ACS.  Fortunately ACS evaluation was reassuring.  Considered PE with CT angio  of the chest but fortunately that test was negative.  Symptoms inconsistent with CHF exacerbation.  Upon reevaluation, patient stated that he still did not have chest pain.  Patient normally followed by [facility] cardiology but was requesting for possible referral to call cardiologist here in [place] if possible.  Provided contact information for Dr. Dina Rich.  Advised to follow-up with him as soon as possible discuss his chest pain in setting of his heart history.  Vital stable at discharge.  Discussed return precautions.  Patient management required discussion with the following services or consulting groups:  None  Complexity of Problems Addressed Acute complicated illness or Injury  Additional Data Reviewed and Analyzed Further history obtained from: Further history from spouse/family member, Past medical history and medications listed in the EMR, and Prior ED visit notes  Patient Encounter Risk Assessment None         Final Clinical Impression(s) / ED Diagnoses Final diagnoses:  Chest pain, unspecified type    Rx / DC Orders ED Discharge Orders     None         Gareth Eagle, PA-C 06/26/22 2353    Gloris Manchester, MD 06/27/22 [phone]

## 2022-06-26 NOTE — Discharge Instructions (Addendum)
Evaluation for your chest pain was overall reassuring.  Recommend that you do follow-up with cardiology.  I provided contact information for local cardiologist here in [place] in the [facility] health system.  You may contact them or they actually may contact you to set up an appointment.  If you have worsening chest pain, shortness of breath, calf tenderness, trouble breathing or any other concerning symptom please return emergency department further evaluation.

## 2022-06-26 NOTE — ED Triage Notes (Signed)
Patient reports chest pain that started yesterday and it radiates to his back.  Also reports indigestion.  Patient reports there is a pain in his right shoulder blade that feels like someone is tearing shoulder blade away from his body. Denies n/v  reports when pain hits he gets short of breath.

## 2022-06-26 NOTE — ED Notes (Signed)
Patient transported to CT 

## 2022-06-26 NOTE — ED Notes (Signed)
See triage notes. Non diaphoretic. Color wnl. Nad at this time.

## 2022-06-27 LAB — I-STAT CHEM 8, ED
BUN: 24 mg/dL — ABNORMAL HIGH (ref 8–23)
Calcium, Ion: 1.25 mmol/L (ref 1.15–1.40)
Chloride: 105 mmol/L (ref 98–111)
Creatinine, Ser: 1 mg/dL (ref 0.61–1.24)
Glucose, Bld: 113 mg/dL — ABNORMAL HIGH (ref 70–99)
HCT: 49 % (ref 39.0–52.0)
Hemoglobin: 16.7 g/dL (ref 13.0–17.0)
Potassium: 4.2 mmol/L (ref 3.5–5.1)
Sodium: 141 mmol/L (ref 135–145)
TCO2: 25 mmol/L (ref 22–32)

## 2022-07-02 ENCOUNTER — Ambulatory Visit: Payer: 59 | Attending: Internal Medicine | Admitting: Internal Medicine

## 2022-07-02 ENCOUNTER — Encounter: Payer: Self-pay | Admitting: Internal Medicine

## 2022-07-02 VITALS — BP 114/64 | HR 70 | Ht 70.0 in | Wt 220.4 lb

## 2022-07-02 DIAGNOSIS — E785 Hyperlipidemia, unspecified: Secondary | ICD-10-CM | POA: Insufficient documentation

## 2022-07-02 DIAGNOSIS — E782 Mixed hyperlipidemia: Secondary | ICD-10-CM | POA: Diagnosis not present

## 2022-07-02 DIAGNOSIS — Z79899 Other long term (current) drug therapy: Secondary | ICD-10-CM

## 2022-07-02 DIAGNOSIS — E7849 Other hyperlipidemia: Secondary | ICD-10-CM

## 2022-07-02 MED ORDER — ISOSORBIDE MONONITRATE ER 30 MG PO TB24: 15.0000 mg | ORAL_TABLET | Freq: Every day | ORAL | 2 refills | Status: DC

## 2022-07-02 MED ORDER — ATORVASTATIN CALCIUM 40 MG PO TABS: 40.0000 mg | ORAL_TABLET | Freq: Every day | ORAL | 2 refills | Status: DC

## 2022-07-02 MED ORDER — METOPROLOL TARTRATE 25 MG PO TABS: 25.0000 mg | ORAL_TABLET | Freq: Two times a day (BID) | ORAL | 2 refills | Status: DC

## 2022-07-02 NOTE — Patient Instructions (Addendum)
Medication Instructions:  Your physician has recommended you make the following change in your medication:  Stop atenolol Start metoprolol tartrate 25 mg twice daily Start isosorbide mononitrate 15 mg daily Decrease atorvastatin to 40 mg daily at bedtime  Labwork: Lipid panel in 3 months at [facility] (July 2024)  Testing/Procedures: none  Follow-Up: Your physician recommends that you schedule a follow-up appointment in: 6 months  Any Other Special Instructions Will Be Listed Below (If Applicable).  If you need a refill on your cardiac medications before your next appointment, please call your pharmacy.

## 2022-07-02 NOTE — Progress Notes (Signed)
Cardiology Office Note  Date: 07/02/2022   ID: Luis Griffin, DOB November 05, 1955, MRN [id number]  PCP:  Juliette Alcide, MD  Cardiologist:  Marjo Bicker, MD Electrophysiologist:  None   Reason for Office Visit: ER follow-up for chest pressure   History of Present Illness: Luis Griffin is a 67 y.o. male known to have CAD s/p CABG in 2005, PCI in 2006, HTN, HLD, , adenocarcinoma of the right lower lobe of the lung s/p right lower lobe lobectomy in 11/2020, secondary polycythemia is referred to cardiology clinic for ER follow-up of chest pressure.  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  After arrival to the ER, he developed a similar quality of chest pressure 3 more times which resolved spontaneously.  He also reported that he had chest pressure on the day before but did not tell his wife if she might get worried about it.  Denies any SOB, dizziness, lightheadedness, palpitations and leg swelling.  Quit smoking 2 years ago after he was diagnosed with lung cancer.  Unclear why he was taking Plavix along with aspirin. Chart review did not reveal any new information.  He is not sure why he is on Plavix as well but informed it could be because of his hematologist/oncologist.  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  The last time he had stress test was 01/2022 which was within normal limits, no evidence of ischemia, had 0.5 to 1 mm horizontal ST segment depressions but when not significant enough.  It was low risk study.  Patient's atorvastatin dose was increased from 40 mg to 80 mg after which she started to have muscle aches all over his body.  Past Medical History:  Diagnosis Date   Coronary artery disease    cardiologsit-  dr Tinnie Gens clevenger    Depression    Frequency of urination    History of MI (myocardial infarction)    Hyperlipidemia    Hypertension    Lung cancer    OA (osteoarthritis)    Productive cough    Prostate cancer UROLOGIST-  DR MCKENZIE/  ONCOLOGIST-  DR MANNING   dx 02/ 2018,  Stage T1c,  PSA 5.0,  Gleason 4+3, vol 45cc   S/P drug eluting coronary stent placement    Smokers' cough    Systemic lupus erythematosus    Urgency of urination    Wears hearing aid in both ears     Past Surgical History:  Procedure Laterality Date   CARDIOVASCULAR STRESS TEST  06-03-2007   dr clevenger ([facility])   moderate sized fixed defect in the inferolateral wall probable scar (poss. attenuation artifact),  small area peri-infarct ischemia in the inferolateral region,  segment of inferolateral is akinetic , septal motion consistant w/ post-op changes,  ef 65%,  no evidence significant ischemia   CORONARY ANGIOPLASTY WITH STENT PLACEMENT  2007  at [facility]   x2 DES per pt   CORONARY ARTERY BYPASS GRAFT  2006   [place]   x4 per pt   DOBUTAMINE STRESS ECHO  09/14/2015   dr j. cleveger ([facility])   normal stress echo-- moderate LVH, ef 55-60%, mild inferior wall hypokinesis,  mild AV sclerosis without stenosis,     LUNG CANCER SURGERY  Right    PROSTATE BIOPSY  05/02/2016   RADIOACTIVE SEED IMPLANT N/A 08/17/2016   Procedure: RADIOACTIVE SEED IMPLANT/BRACHYTHERAPY IMPLANT;  Surgeon: Malen Gauze, MD;  Location: [facility];  Service: Urology;  Laterality: N/A;   TONSILLECTOMY  child   WRIST GANGLION EXCISION Right 2015    Current Outpatient Medications  Medication Sig Dispense Refill   aspirin 325 MG EC tablet Take 325 mg by mouth daily.     atorvastatin (LIPITOR) 40 MG tablet Take 1 tablet (40 mg total) by mouth daily. 90 tablet 2   clopidogrel (PLAVIX) 75 MG tablet Take 75 mg by mouth daily.     diclofenac Sodium (VOLTAREN) 1 % GEL Place onto the skin as needed.     ezetimibe  (ZETIA) 10 MG tablet Take 10 mg by mouth every evening.      isosorbide mononitrate (IMDUR) 30 MG 24 hr tablet Take 0.5 tablets (15 mg total) by mouth daily. 45 tablet 2   lisinopril (ZESTRIL) 20 MG tablet Take 20 mg by mouth daily.     metFORMIN (GLUCOPHAGE) 500 MG tablet Take 500 mg by mouth 2 (two) times daily.     metoprolol tartrate (LOPRESSOR) 25 MG tablet Take 1 tablet (25 mg total) by mouth 2 (two) times daily. 180 tablet 2   nitroGLYCERIN (NITROSTAT) 0.4 MG SL tablet Place under the tongue.     tamsulosin (FLOMAX) 0.4 MG CAPS capsule Take 1 capsule (0.4 mg total) by mouth daily after supper. 90 capsule 3   No current facility-administered medications for this visit.   Allergies:  Varenicline   Social History: The patient  reports that he quit smoking about 20 months ago. His smoking use included cigarettes. He has a 45.00 pack-year smoking history. He quit smokeless tobacco use about 21 months ago.  His smokeless tobacco use included snuff and chew. He reports current alcohol use. He reports that he does not use drugs.   Family History: The patient's family history includes Cancer in his father; Heart disease in his mother; Kidney cancer in his father; Lupus in his maternal grandfather and mother; Rheum arthritis in his maternal aunt; Thyroid disease in his mother.   ROS:  Please see the history of present illness. Otherwise, complete review of systems is positive for none.  All other systems are reviewed and negative.   Physical Exam: VS:  BP 114/64   Pulse 70   Ht 5\' 10"  (1.778 m)   Wt 220 lb 6.4 oz (100 kg)   SpO2 96%   BMI 31.62 kg/m , BMI Body mass index is 31.62 kg/m.  Wt Readings from Last 3 Encounters:  07/02/22 220 lb 6.4 oz (100 kg)  06/26/22 212 lb (96.2 kg)  01/10/21 212 lb (96.2 kg)    General: Patient appears comfortable at rest. HEENT: Conjunctiva and lids normal, oropharynx clear with moist mucosa. Neck: Supple, no elevated JVP or carotid bruits, no  thyromegaly. Lungs: Clear to auscultation, nonlabored breathing at rest. Cardiac: Regular rate and rhythm, no S3 or significant systolic murmur, no pericardial rub. Abdomen: Soft, nontender, no hepatomegaly, bowel sounds present, no guarding or rebound. Extremities: No pitting edema, distal pulses 2+. Skin: Warm and dry. Musculoskeletal: No kyphosis. Neuropsychiatric: Alert and oriented x3, affect grossly appropriate.  ECG: NSR, no ST changes  Recent Labwork: 06/26/2022: ALT 34; AST 21; BUN 24; Creatinine, Ser 1.00; Hemoglobin 16.7; Magnesium 2.0; Platelets 205; Potassium 4.2; Sodium 141  No results found for: "CHOL", "TRIG", "HDL", "CHOLHDL", "VLDL", "LDLCALC", "  LDLDIRECT"  Other Studies Reviewed Today: I reviewed the [facility] notes, imaging studies.  Assessment and Plan: Patient is a 66 year old M known to have CAD s/p CABG in 2005, PCI in 2006, HTN, HLD, , adenocarcinoma of the right lower lobe of the lung s/p right lower lobe lobectomy in 11/2020, secondary polycythemia is referred to cardiology clinic for ER follow-up of chest pressure.  # CAD s/p CABG in 2005 at [facility] and PCI in 2006 at [facility] with normal LVEF, currently angina free -Patient had intermittent chest pressure for 1 to 2 days prompting ER visit on 06/26/2022.  EKG showed NSR, no ST changes. High sensitivity troponins were within normal limits.  Will increase antianginal therapy, switch atenolol to metoprolol tartrate 25 mg twice daily and start Imdur 15 mg once daily.  SL NTG 0.4 mg as needed. He underwent NM stress test in 01/2022 which was normal, hence no need to repeat stress testing. -Continue aspirin 81 mg once daily, unclear why he is on Plavix. Chart review was not helpful. Will continue Plavix for now. -Decrease dose of atorvastatin from 80 mg to 40 mg due to myalgias and continue Zetia 10 mg once daily. -ER precautions for chest pain  # HTN: Switch atenolol to metoprolol tartrate 25 mg twice daily and continue  lisinopril 20 mg once daily. # HLD: Decrease dose of atorvastatin from 80 mg to 40 mg due to myalgias and continue Zetia 10 mg once daily.  Repeat lipid panel in 3 months. LDL was 48 in 12/23.  If LDL goal is not achieved after reducing the dose of atorvastatin from 80 mg to 40 mg, he probably might benefit from injectables, PCSK9 inhibitors or inclisiran.  I discussed with him about injectables today and he seems to be okay with the idea of it.   I have spent a total of 45 minutes with patient reviewing chart, EKGs, labs and examining patient as well as establishing an assessment and plan that was discussed with the patient.  > 50% of time was spent in direct patient care.     Medication Adjustments/Labs and Tests Ordered: Current medicines are reviewed at length with the patient today.  Concerns regarding medicines are outlined above.   Tests Ordered: Orders Placed This Encounter  Procedures   Lipid panel    Medication Changes: Meds ordered this encounter  Medications   metoprolol tartrate (LOPRESSOR) 25 MG tablet    Sig: Take 1 tablet (25 mg total) by mouth 2 (two) times daily.    Dispense:  180 tablet    Refill:  2    07/02/2022 NEW-stop atenolol   isosorbide mononitrate (IMDUR) 30 MG 24 hr tablet    Sig: Take 0.5 tablets (15 mg total) by mouth daily.    Dispense:  45 tablet    Refill:  2    07/02/2022 NEW   atorvastatin (LIPITOR) 40 MG tablet    Sig: Take 1 tablet (40 mg total) by mouth daily.    Dispense:  90 tablet    Refill:  2    07/02/2022 dose decrease    Disposition:  Follow up  6 months  Signed, Bralyn Espino Verne Spurr, MD, 07/02/2022 4:58 PM    [facility] [address], [place], [place] [address]

## 2022-07-10 ENCOUNTER — Telehealth: Payer: Self-pay | Admitting: Internal Medicine

## 2022-07-10 NOTE — Telephone Encounter (Signed)
Spoke with patient.  Seen by Dr. Jenene Slicker on 07/02/22 - was switched from Atenolol to Lopressor  twice a day & Imdur  added.  States that he started new medication on Sunday, 07/08/2022.  Only took on Sunday & Monday, none today.  States he has had terrible headache, upset stomach & difficulty sleeping since beginning these meds.  Explained to him that Imdur can typically cause headaches in the beginning but should ease off after he adjusts to the medication.  Message sent to provider for further advice.

## 2022-07-10 NOTE — Telephone Encounter (Signed)
Pt c/o medication issue:  1. Name of Medication: isosorbide mononitrate (IMDUR) 30 MG 24 hr tablet ; metoprolol tartrate (LOPRESSOR) 25 MG tablet   2. How are you currently taking this medication (dosage and times per day)? Take 1 tablet (25 mg total) by mouth 2 (two) times daily.   3. Are you having a reaction (difficulty breathing--STAT)? No   4. What is your medication issue? Patient said that the medication that was prescribed isn't agreeing with him. Want to know another alternative to medication

## 2022-07-12 NOTE — Telephone Encounter (Signed)
Patient notified via detailed voice message.

## 2022-10-10 IMAGING — PT NM PET TUM IMG INITIAL (PI) SKULL BASE T - THIGH
1 series · 2 of 2 positions shown · non-contrast
Comparison: None.

CLINICAL DATA: Initial treatment strategy for pulmonary nodule.
History of prostate cancer. Smoking history.

EXAM:
NUCLEAR MEDICINE PET SKULL BASE TO THIGH
TECHNIQUE: 10.7 mCi F-18 FDG was injected intravenously. Full-ring PET imaging
was performed from the skull base to thigh after the radiotracer. CT
data was obtained and used for attenuation correction and anatomic
localization.
Fasting blood glucose: 100 mg/dl

[Series 1088: results mm oncology reading · 1.0mm · 1.00mm/px · 2 of 2 slices shown]
[im 1/2]
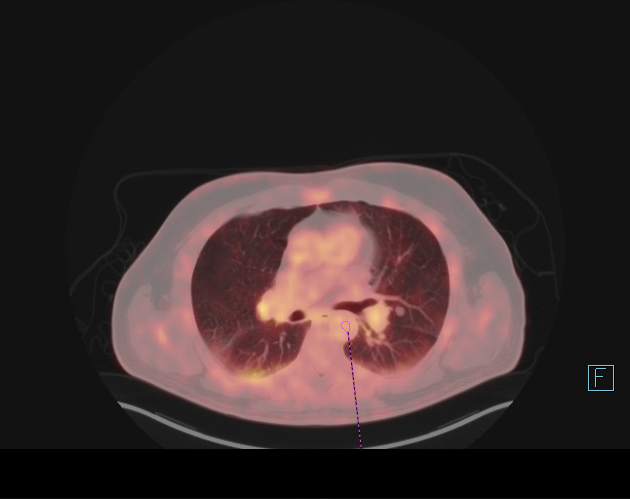
[im 2/2]
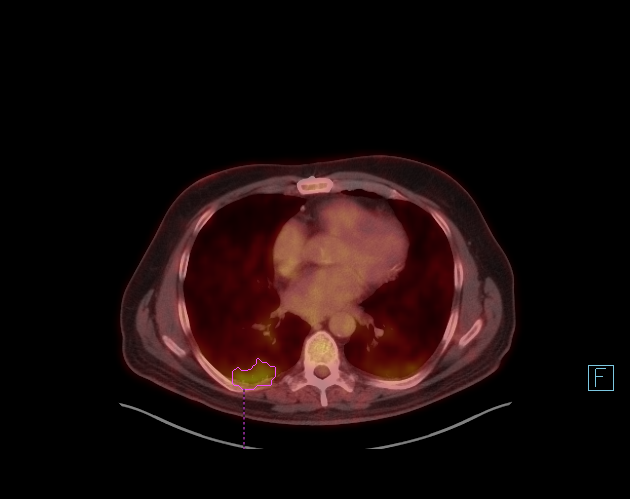

[2 of 2 positions shown; findings below may reference images not displayed]

FINDINGS: Mediastinal blood pool activity: SUV max

Liver activity: SUV max NA

NECK: No hypermetabolic lymph nodes in the neck.

Incidental CT findings: none

CHEST: The nodule of concern in the superior segment of the LEFT
lower lobe is less well-defined on today's imaging. Sub solid nodule
at this level measures 11 mm (image 86) compared to a more
solid-appearing nodule measuring 12 mm on CT 04/22/2020. There is
differing technique.

There is a subpleural cystic change adjacent to this nodularity
slightly more inferior. There is metabolic activity associated with
this region which is a nonfocal with SUV max equal 4.1 (image 88).

No metabolic activity associated with previous described nodules
including 9 mm central LEFT upper lobe nodule on image 79.

No hypermetabolic mediastinal lymph nodes.

Incidental CT findings: none

ABDOMEN/PELVIS: No abnormal hypermetabolic activity within the
liver, pancreas, adrenal glands, or spleen. No hypermetabolic lymph
nodes in the abdomen or pelvis.

Incidental CT findings: Brachytherapy seeds in the prostate gland.

SKELETON: No focal hypermetabolic activity to suggest skeletal
metastasis.

Incidental CT findings: none
IMPRESSION: 1. The RIGHT lower lobe nodule of concern is less conspicuous on
today's exam although differing technique. There is hypermetabolic
activity in this vicinity however it appears to be more associated
with subpleural reticular cystic change rather than discretely
within the nodule. Favor infectious or inflammatory process.
Recommend continued CT surveillance.
2. Additional pulmonary nodule described on comparison CTs are non
hypermetabolic.
3. No hypermetabolic mediastinal adenopathy.

## 2022-10-31 ENCOUNTER — Ambulatory Visit (INDEPENDENT_AMBULATORY_CARE_PROVIDER_SITE_OTHER): Payer: 59

## 2022-10-31 ENCOUNTER — Encounter: Payer: Self-pay | Admitting: Internal Medicine

## 2022-10-31 ENCOUNTER — Ambulatory Visit: Payer: 59 | Attending: Internal Medicine | Admitting: Internal Medicine

## 2022-10-31 VITALS — BP 110/69 | HR 64 | Resp 14 | Ht 71.0 in | Wt 212.0 lb

## 2022-10-31 DIAGNOSIS — M65341 Trigger finger, right ring finger: Secondary | ICD-10-CM

## 2022-10-31 DIAGNOSIS — M2550 Pain in unspecified joint: Secondary | ICD-10-CM | POA: Diagnosis not present

## 2022-10-31 NOTE — Progress Notes (Signed)
Office Visit Note  Patient: Luis Griffin             Date of Birth: 04-Jun-1955           MRN: [id number]             PCP: Juliette Alcide, MD Referring: Juliette Alcide, MD Visit Date: 10/31/2022   Subjective:  Follow-up (Patient states he has stiffness, pain, and swelling in his fourth digit on the right hand. Patient states his finger locks up. Patient states his hand feels like fire sometimes. )   History of Present Illness: Luis Griffin is a 67 y.o. male here for follow up for right hand pain and triggering in the 4th finger. This has started again since about 4 or 5 months ago and getting worse during that time. If the last few weeks is having more burning type painful sensation sometimes extending to the tip of the finger.  Previous injection worked very well improvement in pain and triggering symptoms lasted for over a year.  Did not have any new injury or activity changed associated.  Previous HPI 01/10/21 Luis Griffin is a 67 y.o. male here for follow up with joint pains initial evaluation with negative inflammatory markers and negative repeat ANA with some osteoarthritis changes and trigger finger, steroid injection of 5th digit was performed at the time. He felt good improvement in the trigger finger symptoms until the past week has return of triggering of 4th and 5th finger, 5th finger worst. Since our last visit he was diagnosed with adenocarcinoma of the lung with lobectomy and upcoming plan to start chemotherapy, first round starting tomorrow.   Previous HPI 03/01/20 Luis Griffin is a 67 y.o. male with history of prostate cancer and CAD here for evaluation of positive ANA and arthralgias. He has a long history of generalized joint pains but in the past couple months his arms are having a lot of pain especially in the right hand and the elbows. He has triggering of the right small finger that is increasing. He also notices numbness of fingers in the right hand  that worsens when holding a steering for prolonged duration. He has some bony enlargement of the hadns but does not notice much soft tissue swelling or redness.   Review of Systems  Constitutional:  Positive for fatigue.  HENT:  Negative for mouth sores and mouth dryness.   Eyes:  Negative for dryness.  Respiratory:  Positive for shortness of breath.   Cardiovascular:  Negative for chest pain and palpitations.  Gastrointestinal:  Negative for blood in stool, constipation and diarrhea.  Endocrine: Negative for increased urination.  Genitourinary:  Negative for involuntary urination.  Musculoskeletal:  Positive for joint pain, joint pain, joint swelling and muscle weakness. Negative for gait problem, myalgias, morning stiffness, muscle tenderness and myalgias.  Skin:  Positive for rash. Negative for color change, hair loss and sensitivity to sunlight.  Allergic/Immunologic: Negative for susceptible to infections.  Neurological:  Negative for dizziness and headaches.  Hematological:  Negative for swollen glands.  Psychiatric/Behavioral:  Positive for sleep disturbance. Negative for depressed mood. The patient is not nervous/anxious.     PMFS History:  Patient Active Problem List   Diagnosis Date Noted   HLD (hyperlipidemia) 07/02/2022   Encounter for insertion of venous access port 12/22/2020   Malignant neoplasm of lower lobe of right lung (HCC) 11/23/2020   Encounter for smoking cessation counseling 06/08/2020   Nephrolithiasis 04/27/2020  Coronary artery disease involving native coronary artery of native heart with angina pectoris (HCC) 04/08/2020   Lower abdominal pain 04/08/2020   Panlobular emphysema (HCC) 04/08/2020   Polycythemia 04/08/2020   Numbness and tingling in right hand 03/01/2020   Trigger finger, right ring finger 03/01/2020   Polyarthralgia 03/01/2020   Rash and other nonspecific skin eruption 03/01/2020   Weak urinary stream 04/22/2019   Benign prostatic  hyperplasia with urinary obstruction 04/22/2019   Old MI (myocardial infarction) 09/10/2016   Dysuria 09/07/2016   Prostate cancer (HCC) 05/30/2016   Internal hemorrhoids 01/08/2011   Depressive disorder 12/05/2009   Hypertension, benign 12/05/2009    Past Medical History:  Diagnosis Date   Coronary artery disease    cardiologsit-  dr Tinnie Gens clevenger   Depression    Frequency of urination    History of MI (myocardial infarction)    Hyperlipidemia    Hypertension    Lung cancer (HCC)    OA (osteoarthritis)    Productive cough    Prostate cancer (HCC) UROLOGIST-  DR MCKENZIE/  ONCOLOGIST-  DR MANNING   dx 02/ 2018,  Stage T1c,  PSA 5.0,  Gleason 4+3, vol 45cc   S/P drug eluting coronary stent placement    Smokers' cough (HCC)    Systemic lupus erythematosus (HCC)    Urgency of urination    Wears hearing aid in both ears     Family History  Problem Relation Age of Onset   Cancer Father        prostate ca   Kidney cancer Father    Heart disease Mother    Lupus Mother    Thyroid disease Mother    Rheum arthritis Maternal Aunt    Lupus Maternal Grandfather    Past Surgical History:  Procedure Laterality Date   CARDIOVASCULAR STRESS TEST  06-03-2007   dr clevenger ([facility])   moderate sized fixed defect in the inferolateral wall probable scar (poss. attenuation artifact),  small area peri-infarct ischemia in the inferolateral region,  segment of inferolateral is akinetic , septal motion consistant w/ post-op changes,  ef 65%,  no evidence significant ischemia   CORONARY ANGIOPLASTY WITH STENT PLACEMENT  2007  at [facility]   x2 DES per pt   CORONARY ARTERY BYPASS GRAFT  2006   [place]   x4 per pt   DOBUTAMINE STRESS ECHO  09/14/2015   dr j. cleveger ([facility])   normal stress echo-- moderate LVH, ef 55-60%, mild inferior wall hypokinesis,  mild AV sclerosis without stenosis,     LUNG CANCER SURGERY Right    PROSTATE BIOPSY  05/02/2016   RADIOACTIVE  SEED IMPLANT N/A 08/17/2016   Procedure: RADIOACTIVE SEED IMPLANT/BRACHYTHERAPY IMPLANT;  Surgeon: Malen Gauze, MD;  Location: [facility];  Service: Urology;  Laterality: N/A;   TONSILLECTOMY  child   WRIST GANGLION EXCISION Right 2015   Social History   Social History Narrative   Not on file   Immunization History  Administered Date(s) Administered   Influenza Inj Mdck Quad Pf 01/04/2017, 01/31/2018   Influenza Split 04/15/2006   Influenza, Seasonal, Injecte, Preservative Fre 04/15/2006   Influenza,inj,quad, With Preservative 12/24/2018   Moderna Sars-Covid-2 Vaccination 05/15/2019, 05/18/2019, 06/12/2019, 06/18/2019, 02/17/2020, 02/20/2020   Pneumococcal Conjugate-13 09/01/2012   Td 06/26/2001   Tdap 02/25/2014   Zoster Recombinant(Shingrix) 09/23/2018, 11/25/2018   Zoster, Live 03/05/2016     Objective: Vital Signs: BP 110/69 (BP Location: Left Arm, Patient Position: Sitting, Cuff Size: Normal)  Pulse 64   Resp 14   Ht 5\' 11"  (1.803 m)   Wt 212 lb (96.2 kg)   BMI 29.57 kg/m    Physical Exam Skin:    General: Skin is warm and dry.     Findings: No rash.  Neurological:     Mental Status: He is alert.  Psychiatric:        Mood and Affect: Mood normal.      Musculoskeletal Exam:  Elbows full ROM no tenderness or swelling Wrists full ROM no tenderness or swelling Fingers right 4th digit triggering in mostly flexed position, palpable nodule with tenderness proximal to MCP joint on palmar surface, no synovitis in finger joints, other fingers full ROM  Investigation: No additional findings.  Imaging: No results found.  Recent Labs: Lab Results  Component Value Date   WBC 7.7 06/26/2022   HGB 16.7 06/26/2022   PLT 205 06/26/2022   NA 141 06/26/2022   K 4.2 06/26/2022   CL 105 06/26/2022   CO2 25 06/26/2022   GLUCOSE 113 (H) 06/26/2022   BUN 24 (H) 06/26/2022   CREATININE 1.00 06/26/2022   BILITOT 0.9 06/26/2022   ALKPHOS 73  06/26/2022   AST 21 06/26/2022   ALT 34 06/26/2022   PROT 7.1 06/26/2022   ALBUMIN 4.4 06/26/2022   CALCIUM 9.7 06/26/2022   GFRAA >60 08/10/2016    Speciality Comments: No specialty comments available.  Procedures:  Hand/UE Inj: R ring A1 for trigger finger on 10/31/2022 1:35 PM Indications: pain, tendon swelling and therapeutic Details: 25 G needle, ultrasound-guided volar approach Medications: 0.5 mL lidocaine 1 %; 20 mg triamcinolone acetonide 40 MG/ML Outcome: tolerated well, no immediate complications Procedure, treatment alternatives, risks and benefits explained, specific risks discussed. Consent was given by the patient. Immediately prior to procedure a time out was called to verify the correct patient, procedure, equipment, support staff and site/side marked as required. Patient was prepped and draped in the usual sterile fashion.     Allergies: Varenicline   Assessment / Plan:     Visit Diagnoses: Polyarthralgia  Has some generalized osteoarthritis of the other areas but not currently in exacerbation.  No evidence of synovitis or tenosynovitis otherwise in hands or wrists.  Trigger finger, right ring finger - Plan: US Guided Needle Placement  Right fourth digit trigger finger with definite flexor tendon nodule.  Discussed treatment options including conservative management, or possibility for hand surgery referral due to recurrent trigger fingers.  Given the previous injection had duration of benefit more than a year and also this is now a different nodule requiring injection I think it is reasonable to continue less invasive treatment approach for now.  Ultrasound-guided steroid injection today.  Orders: Orders Placed This Encounter  Procedures   Hand/UE Inj   US Guided Needle Placement   No orders of the defined types were placed in this encounter.    Follow-Up Instructions: Return in about 6 weeks (around 12/12/2022), or if symptoms worsen or fail to  improve.   Fuller Plan, MD  Note - This record has been created using [employer].  Chart creation errors have been sought, but may not always  have been located. Such creation errors do not reflect on  the standard of medical care.

## 2022-11-09 MED ORDER — TRIAMCINOLONE ACETONIDE 40 MG/ML IJ SUSP
20.0000 mg | INTRAMUSCULAR | Status: AC | PRN
Start: 2022-10-31 — End: 2022-10-31
  Administered 2022-10-31: 20 mg

## 2022-11-09 MED ORDER — LIDOCAINE HCL 1 % IJ SOLN
0.5000 mL | INTRAMUSCULAR | Status: AC | PRN
Start: 2022-10-31 — End: 2022-10-31
  Administered 2022-10-31: .5 mL

## 2023-01-09 ENCOUNTER — Encounter: Payer: Self-pay | Admitting: Internal Medicine

## 2023-01-09 ENCOUNTER — Ambulatory Visit: Payer: 59 | Attending: Internal Medicine | Admitting: Internal Medicine

## 2023-01-09 ENCOUNTER — Other Ambulatory Visit: Payer: Self-pay | Admitting: Internal Medicine

## 2023-01-09 VITALS — BP 108/68 | HR 75 | Ht 71.0 in | Wt 212.8 lb

## 2023-01-09 DIAGNOSIS — I252 Old myocardial infarction: Secondary | ICD-10-CM

## 2023-01-09 DIAGNOSIS — E782 Mixed hyperlipidemia: Secondary | ICD-10-CM | POA: Diagnosis not present

## 2023-01-09 DIAGNOSIS — Z0289 Encounter for other administrative examinations: Secondary | ICD-10-CM

## 2023-01-09 DIAGNOSIS — I10 Essential (primary) hypertension: Secondary | ICD-10-CM

## 2023-01-09 MED ORDER — BEMPEDOIC ACID-EZETIMIBE 180-10 MG PO TABS: 1.0000 | ORAL_TABLET | Freq: Every day | ORAL | 2 refills | Status: DC

## 2023-01-09 MED ORDER — METOPROLOL TARTRATE 25 MG PO TABS: 25.0000 mg | ORAL_TABLET | Freq: Two times a day (BID) | ORAL | 2 refills | Status: DC

## 2023-01-09 NOTE — Patient Instructions (Addendum)
Medication Instructions:  Your physician has recommended you make the following change in your medication:  Stop taking Atorvastatin and Zetia Start taking Bempedoic-zetia 180-10 mg daily Continue taking all other medications as prescribed  Labwork: Lipids in 3 months to be completed at [facility]  Testing/Procedures: Your physician has requested that you have en exercise stress myoview. For further information please visit [email or web address] Please follow instruction sheet, as given.   Follow-Up: Your physician recommends that you schedule a follow-up appointment in: 6 months  Any Other Special Instructions Will Be Listed Below (If Applicable). Thank you for choosing [facility]!     If you need a refill on your cardiac medications before your next appointment, please call your pharmacy.

## 2023-01-09 NOTE — Progress Notes (Unsigned)
Cardiology Office Note  Date: 01/09/2023   ID: Luis Griffin, DOB 04/18/1955, MRN [id number]  PCP:  Juliette Alcide, MD  Cardiologist:  Marjo Bicker, MD Electrophysiologist:  None   Reason for Office Visit: ER follow-up for chest pressure   History of Present Illness: Luis Griffin is a 67 y.o. male known to have CAD s/p CABG in 2005, PCI in 2006, HTN, HLD, , adenocarcinoma of the right lower lobe of the lung s/p right lower lobe lobectomy in 11/2020, secondary polycythemia is referred to cardiology clinic for ER follow-up of chest pressure.  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  After arrival to the ER, he developed a similar quality of chest pressure 3 more times which resolved spontaneously.  He also reported that he had chest pressure on the day before but did not tell his wife if she might get worried about it.  Denies any SOB, dizziness, lightheadedness, palpitations and leg swelling.  Quit smoking 2 years ago after he was diagnosed with lung cancer.  Unclear why he was taking Plavix along with aspirin. Chart review did not reveal any new information.  He is not sure why he is on Plavix as well but informed it could be because of his hematologist/oncologist.  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  The last time he had stress test was 01/2022 which was within normal limits, no evidence of ischemia, had 0.5 to 1 mm horizontal ST segment depressions but when not significant enough.  It was low risk study.  Patient's atorvastatin dose was increased from 40 mg to 80 mg after which she started to have muscle aches all over his body.  Past Medical History:  Diagnosis Date   Coronary artery disease    cardiologsit-  dr Tinnie Gens clevenger    Depression    Frequency of urination    History of MI (myocardial infarction)    Hyperlipidemia    Hypertension    Lung cancer (HCC)    OA (osteoarthritis)    Productive cough    Prostate cancer (HCC) UROLOGIST-  DR MCKENZIE/  ONCOLOGIST-  DR MANNING   dx 02/ 2018,  Stage T1c,  PSA 5.0,  Gleason 4+3, vol 45cc   S/P drug eluting coronary stent placement    Smokers' cough (HCC)    Systemic lupus erythematosus (HCC)    Urgency of urination    Wears hearing aid in both ears     Past Surgical History:  Procedure Laterality Date   CARDIOVASCULAR STRESS TEST  06-03-2007   dr clevenger ([facility])   moderate sized fixed defect in the inferolateral wall probable scar (poss. attenuation artifact),  small area peri-infarct ischemia in the inferolateral region,  segment of inferolateral is akinetic , septal motion consistant w/ post-op changes,  ef 65%,  no evidence significant ischemia   CORONARY ANGIOPLASTY WITH STENT PLACEMENT  2007  at [facility]   x2 DES per pt   CORONARY ARTERY BYPASS GRAFT  2006   [place]   x4 per pt   DOBUTAMINE STRESS ECHO  09/14/2015   dr j. cleveger ([facility])   normal stress echo-- moderate LVH, ef 55-60%, mild inferior wall hypokinesis,  mild AV sclerosis without stenosis,  LUNG CANCER SURGERY Right    PROSTATE BIOPSY  05/02/2016   RADIOACTIVE SEED IMPLANT N/A 08/17/2016   Procedure: RADIOACTIVE SEED IMPLANT/BRACHYTHERAPY IMPLANT;  Surgeon: Malen Gauze, MD;  Location: [facility];  Service: Urology;  Laterality: N/A;   TONSILLECTOMY  child   WRIST GANGLION EXCISION Right 2015    Current Outpatient Medications  Medication Sig Dispense Refill   aspirin 325 MG EC tablet Take 325 mg by mouth daily.     atorvastatin (LIPITOR) 40 MG tablet Take 1 tablet (40 mg total) by mouth daily. 90 tablet 2   clopidogrel (PLAVIX) 75 MG tablet Take 75 mg by mouth daily.     diclofenac Sodium (VOLTAREN) 1 % GEL Place onto the skin  as needed.     ezetimibe (ZETIA) 10 MG tablet Take 10 mg by mouth every evening.      lisinopril (ZESTRIL) 20 MG tablet Take 20 mg by mouth daily.     metFORMIN (GLUCOPHAGE) 500 MG tablet Take 500 mg by mouth 2 (two) times daily.     metoprolol tartrate (LOPRESSOR) 25 MG tablet Take 1 tablet (25 mg total) by mouth 2 (two) times daily. 180 tablet 2   nitroGLYCERIN (NITROSTAT) 0.4 MG SL tablet Place under the tongue.     tamsulosin (FLOMAX) 0.4 MG CAPS capsule Take 1 capsule (0.4 mg total) by mouth daily after supper. 90 capsule 3   atenolol (TENORMIN) 25 MG tablet Take 25 mg by mouth 2 (two) times daily. (Patient not taking: Reported on 01/09/2023)     No current facility-administered medications for this visit.   Allergies:  Varenicline   Social History: The patient  reports that he quit smoking about 2 years ago. His smoking use included cigarettes. He started smoking about 47 years ago. He has a 45 pack-year smoking history. He quit smokeless tobacco use about 2 years ago.  His smokeless tobacco use included snuff and chew. He reports current alcohol use. He reports that he does not use drugs.   Family History: The patient's family history includes Cancer in his father; Heart disease in his mother; Kidney cancer in his father; Lupus in his maternal grandfather and mother; Rheum arthritis in his maternal aunt; Thyroid disease in his mother.   ROS:  Please see the history of present illness. Otherwise, complete review of systems is positive for none.  All other systems are reviewed and negative.   Physical Exam: VS:  BP 108/68 (BP Location: Left Arm, Patient Position: Sitting, Cuff Size: Normal)   Pulse 75   Ht 5\' 11"  (1.803 m)   Wt 212 lb 12.8 oz (96.5 kg)   SpO2 94%   BMI 29.68 kg/m , BMI Body mass index is 29.68 kg/m.  Wt Readings from Last 3 Encounters:  01/09/23 212 lb 12.8 oz (96.5 kg)  10/31/22 212 lb (96.2 kg)  07/02/22 220 lb 6.4 oz (100 kg)    General: Patient appears  comfortable at rest. HEENT: Conjunctiva and lids normal, oropharynx clear with moist mucosa. Neck: Supple, no elevated JVP or carotid bruits, no thyromegaly. Lungs: Clear to auscultation, nonlabored breathing at rest. Cardiac: Regular rate and rhythm, no S3 or significant systolic murmur, no pericardial rub. Abdomen: Soft, nontender, no hepatomegaly, bowel sounds present, no guarding or rebound. Extremities: No pitting edema, distal pulses 2+. Skin: Warm and dry. Musculoskeletal: No kyphosis. Neuropsychiatric: Alert and oriented x3, affect grossly appropriate.  ECG: NSR, no ST changes  Recent Labwork: 06/26/2022: ALT 34; AST 21; BUN 24;  Creatinine, Ser 1.00; Hemoglobin 16.7; Magnesium 2.0; Platelets 205; Potassium 4.2; Sodium 141  No results found for: "CHOL", "TRIG", "HDL", "CHOLHDL", "VLDL", "LDLCALC", "LDLDIRECT"  Other Studies Reviewed Today: I reviewed the [facility] notes, imaging studies.  Assessment and Plan: Patient is a 67 year old M known to have CAD s/p CABG in 2005, PCI in 2006, HTN, HLD, , adenocarcinoma of the right lower lobe of the lung s/p right lower lobe lobectomy in 11/2020, secondary polycythemia is referred to cardiology clinic for ER follow-up of chest pressure.  # CAD s/p CABG in 2005 at [facility] and PCI in 2006 at [facility] with normal LVEF, currently angina free -Patient had intermittent chest pressure for 1 to 2 days prompting ER visit on 06/26/2022.  EKG showed NSR, no ST changes. High sensitivity troponins were within normal limits.  Will increase antianginal therapy, switch atenolol to metoprolol tartrate 25 mg twice daily and start Imdur 15 mg once daily.  SL NTG 0.4 mg as needed. He underwent NM stress test in 01/2022 which was normal, hence no need to repeat stress testing. -Continue aspirin 81 mg once daily, unclear why he is on Plavix. Chart review was not helpful. Will continue Plavix for now. -Decrease dose of atorvastatin from 80 mg to 40 mg due to myalgias and  continue Zetia 10 mg once daily. -ER precautions for chest pain  # HTN: Switch atenolol to metoprolol tartrate 25 mg twice daily and continue lisinopril 20 mg once daily. # HLD: Decrease dose of atorvastatin from 80 mg to 40 mg due to myalgias and continue Zetia 10 mg once daily.  Repeat lipid panel in 3 months. LDL was 48 in 12/23.  If LDL goal is not achieved after reducing the dose of atorvastatin from 80 mg to 40 mg, he probably might benefit from injectables, PCSK9 inhibitors or inclisiran.  I discussed with him about injectables today and he seems to be okay with the idea of it.   I have spent a total of 45 minutes with patient reviewing chart, EKGs, labs and examining patient as well as establishing an assessment and plan that was discussed with the patient.  > 50% of time was spent in direct patient care.     Medication Adjustments/Labs and Tests Ordered: Current medicines are reviewed at length with the patient today.  Concerns regarding medicines are outlined above.   Tests Ordered: Orders Placed This Encounter  Procedures   EKG 12-Lead    Medication Changes: No orders of the defined types were placed in this encounter.   Disposition:  Follow up  6 months  Signed, Fumiko Cham Verne Spurr, MD, 01/09/2023 3:07 PM    [facility] [address] [place], [place] [address]

## 2023-01-21 NOTE — Progress Notes (Signed)
Office Visit Note  Patient: Luis Griffin             Date of Birth: 22-Sep-1955           MRN: [id number]             PCP: Juliette Alcide, MD Referring: Juliette Alcide, MD Visit Date: 02/04/2023   Subjective:  Follow-up   History of Present Illness: Luis Griffin is a 67 y.o. male here for follow up for right hand pain and triggering in the 4th finger.  We saw him for ultrasound-guided trigger finger injection in August which was initially helpful but he is already experiencing consistent sticking every day.  Not a lot of associated pain.  His other complaint is sharp pain on the right elbow often occurs during activity and when aggravated will even feel area of heat or swelling.  Intermittently feels numbness radiating from elbow involving fourth and fifth finger on his right hand.  Previous HPI 10/31/2022 Luis Griffin is a 67 y.o. male here for follow up for right hand pain and triggering in the 4th finger. This has started again since about 4 or 5 months ago and getting worse during that time. If the last few weeks is having more burning type painful sensation sometimes extending to the tip of the finger.  Previous injection worked very well improvement in pain and triggering symptoms lasted for over a year.  Did not have any new injury or activity changed associated.   Previous HPI 01/10/21 Luis Griffin is a 67 y.o. male here for follow up with joint pains initial evaluation with negative inflammatory markers and negative repeat ANA with some osteoarthritis changes and trigger finger, steroid injection of 5th digit was performed at the time. He felt good improvement in the trigger finger symptoms until the past week has return of triggering of 4th and 5th finger, 5th finger worst. Since our last visit he was diagnosed with adenocarcinoma of the lung with lobectomy and upcoming plan to start chemotherapy, first round starting tomorrow.   Previous HPI 03/01/20 Luis Griffin is a 67 y.o. male with history of prostate cancer and CAD here for evaluation of positive ANA and arthralgias. He has a long history of generalized joint pains but in the past couple months his arms are having a lot of pain especially in the right hand and the elbows. He has triggering of the right small finger that is increasing. He also notices numbness of fingers in the right hand that worsens when holding a steering for prolonged duration. He has some bony enlargement of the hadns but does not notice much soft tissue swelling or redness.   Review of Systems  Constitutional:  Negative for fatigue.  HENT:  Negative for mouth sores and mouth dryness.   Eyes:  Negative for dryness.  Respiratory:  Negative for shortness of breath.   Cardiovascular:  Negative for chest pain and palpitations.  Gastrointestinal:  Positive for blood in stool. Negative for constipation and diarrhea.  Endocrine: Negative for increased urination.  Genitourinary:  Negative for involuntary urination.  Musculoskeletal:  Positive for joint pain, joint pain and joint swelling. Negative for gait problem, myalgias, muscle weakness, morning stiffness, muscle tenderness and myalgias.  Skin:  Positive for rash. Negative for color change, hair loss and sensitivity to sunlight.  Allergic/Immunologic: Negative for susceptible to infections.  Neurological:  Negative for dizziness and headaches.  Hematological:  Negative for swollen glands.  Psychiatric/Behavioral:  Positive for sleep disturbance. Negative for depressed mood. The patient is not nervous/anxious.     PMFS History:  Patient Active Problem List   Diagnosis Date Noted   Lateral epicondylitis, right elbow 02/04/2023   HLD (hyperlipidemia) 07/02/2022   Encounter for insertion of venous access port 12/22/2020   Malignant neoplasm of lower lobe of right lung (HCC) 11/23/2020   Encounter for pre-employment examination 06/08/2020   Nephrolithiasis 04/27/2020   CAD  (coronary artery disease) 04/08/2020   Lower abdominal pain 04/08/2020   Panlobular emphysema (HCC) 04/08/2020   Polycythemia 04/08/2020   Numbness and tingling in right hand 03/01/2020   Trigger finger, right ring finger 03/01/2020   Polyarthralgia 03/01/2020   Rash and other nonspecific skin eruption 03/01/2020   Weak urinary stream 04/22/2019   Benign prostatic hyperplasia with urinary obstruction 04/22/2019   Old MI (myocardial infarction) 09/10/2016   Dysuria 09/07/2016   Prostate cancer (HCC) 05/30/2016   Internal hemorrhoids 01/08/2011   Depressive disorder 12/05/2009   Hypertension, benign 12/05/2009    Past Medical History:  Diagnosis Date   Coronary artery disease    cardiologsit-  dr Tinnie Gens clevenger   Depression    Frequency of urination    History of MI (myocardial infarction)    Hyperlipidemia    Hypertension    Lung cancer (HCC)    OA (osteoarthritis)    Productive cough    Prostate cancer (HCC) UROLOGIST-  DR MCKENZIE/  ONCOLOGIST-  DR MANNING   dx 02/ 2018,  Stage T1c,  PSA 5.0,  Gleason 4+3, vol 45cc   S/P drug eluting coronary stent placement    Smokers' cough (HCC)    Systemic lupus erythematosus (HCC)    Urgency of urination    Wears hearing aid in both ears     Family History  Problem Relation Age of Onset   Cancer Father        prostate ca   Kidney cancer Father    Heart disease Mother    Lupus Mother    Thyroid disease Mother    Rheum arthritis Maternal Aunt    Lupus Maternal Grandfather    Past Surgical History:  Procedure Laterality Date   CARDIOVASCULAR STRESS TEST  06-03-2007   dr clevenger ([facility])   moderate sized fixed defect in the inferolateral wall probable scar (poss. attenuation artifact),  small area peri-infarct ischemia in the inferolateral region,  segment of inferolateral is akinetic , septal motion consistant w/ post-op changes,  ef 65%,  no evidence significant ischemia   CORONARY ANGIOPLASTY WITH STENT PLACEMENT  2007   at [facility]   x2 DES per pt   CORONARY ARTERY BYPASS GRAFT  2006   [place]   x4 per pt   DOBUTAMINE STRESS ECHO  09/14/2015   dr j. cleveger ([facility])   normal stress echo-- moderate LVH, ef 55-60%, mild inferior wall hypokinesis,  mild AV sclerosis without stenosis,     LUNG CANCER SURGERY Right    PROSTATE BIOPSY  05/02/2016   RADIOACTIVE SEED IMPLANT N/A 08/17/2016   Procedure: RADIOACTIVE SEED IMPLANT/BRACHYTHERAPY IMPLANT;  Surgeon: Malen Gauze, MD;  Location: [facility];  Service: Urology;  Laterality: N/A;   TONSILLECTOMY  child   WRIST GANGLION EXCISION Right 2015   Social History   Social History Narrative   Not on file   Immunization History  Administered Date(s) Administered   Influenza Inj Mdck Quad Pf 01/04/2017, 01/31/2018   Influenza Split 04/15/2006  Influenza, Seasonal, Injecte, Preservative Fre 04/15/2006   Influenza,inj,quad, With Preservative 12/24/2018   Moderna Sars-Covid-2 Vaccination 05/15/2019, 05/18/2019, 06/12/2019, 06/18/2019, 02/17/2020, 02/20/2020   Pneumococcal Conjugate-13 09/01/2012   Td 06/26/2001   Tdap 02/25/2014   Zoster Recombinant(Shingrix) 09/23/2018, 11/25/2018   Zoster, Live 03/05/2016     Objective: Vital Signs: BP 122/77 (BP Location: Left Arm, Patient Position: Sitting, Cuff Size: Normal)   Pulse 73   Resp 14   Ht 5\' 10"  (1.778 m)   Wt 211 lb (95.7 kg)   BMI 30.28 kg/m    Physical Exam Cardiovascular:     Rate and Rhythm: Normal rate and regular rhythm.  Pulmonary:     Effort: Pulmonary effort is normal.     Breath sounds: Normal breath sounds.  Musculoskeletal:     Right lower leg: No edema.  Skin:    General: Skin is warm and dry.     Findings: No rash.  Neurological:     Mental Status: He is alert.      Musculoskeletal Exam:  Shoulders full ROM no tenderness or swelling Right elbow tenderness to pressure worst over and slightly distal to lateral epicondyle, pain  is increased with resisted wrist extension Numbness and tingling to percussion over cubital tunnel and does radiate into the ulnar side of the right hand Wrists full ROM no tenderness or swelling Right fourth finger with palpable nodule proximal to MCP minimally tender consistently triggering although able to fully flex and straighten without assistance Knees full ROM no tenderness or swelling   Investigation: No additional findings.  Imaging: NM Myocar Multi W/Spect W/Wall Motion / EF  Result Date: 01/23/2023   Patient exercised for 4 minutes 6 seconds, per Bruce protocol, achieving 6.10 METS. The resting HR was 72 bpm which increased to 133 bpm, 86% MAPHR. Below average exercise capacity. Elevated BP response and normal HR response. No angina during the test. The test was stopped due to dyspnea.   Baseline EKG showed TWI in inferior and anterolateral leads. Stress ECG is uninterpretable due to artifact.   LV perfusion is abnormal. There is evidence of ischemia. There is evidence of infarction with peri-infarct ischemia. There is a large fixed perfusion defect with severe reduction in uptake present in the entire inferior wall with abnormal wall motion consistent with infarction. There is a medium sized partially reversible perfusion defect with moderate reduction in uptake present in the entire inferolateral location with abnormal wall motion in the defect area consistent with infarction and peri-infarct ischemia. There is a small reversible perfusion defect with mild reduction in uptake present in the entire anterolateral wall with abnormal wall motion in the defect area consistent with ischemia.   Left ventricular function is abnormal. Global function is moderately reduced. Nuclear stress EF: 40%. End diastolic cavity size is moderately enlarged. End systolic cavity size is mildly enlarged.   Findings are consistent with infarction with peri-infarct ischemia. The study is high risk.    Recent  Labs: Lab Results  Component Value Date   WBC 7.7 06/26/2022   HGB 16.7 06/26/2022   PLT 205 06/26/2022   NA 141 06/26/2022   K 4.2 06/26/2022   CL 105 06/26/2022   CO2 25 06/26/2022   GLUCOSE 113 (H) 06/26/2022   BUN 24 (H) 06/26/2022   CREATININE 1.00 06/26/2022   BILITOT 0.9 06/26/2022   ALKPHOS 73 06/26/2022   AST 21 06/26/2022   ALT 34 06/26/2022   PROT 7.1 06/26/2022   ALBUMIN 4.4 06/26/2022   CALCIUM  9.7 06/26/2022   GFRAA >60 08/10/2016    Speciality Comments: No specialty comments available.  Procedures:  No procedures performed Allergies: Varenicline   Assessment / Plan:     Visit Diagnoses: Trigger finger, right ring finger - S/P Hand/UE Inj: R ring A1 for trigger finger on 10/31/2022 - Plan: Ambulatory referral to Hand Surgery  Symptoms is not severe but with recurrence within just a few months after previous injection and after another month before that I think he will benefit from more definitive surgical management.  Referral to hand surgery for evaluation and treatment.  Lateral epicondylitis, right elbow - Plan: Ambulatory referral to Hand Surgery  Right elbow pain is consistent with lateral epicondylitis.  Bone spurring also present is fits with a history of chronic symptoms.  No red flags for weakness there is no palpable joint or bursa swelling.  Provided recommendation on use of elbow brace and range of motion exercises and topical Voltaren.  He can also discuss this in orthopedic surgery office whether they think he would need injection or therapy.  Polyarthralgia  Has widespread osteoarthritis in multiple areas with associated chronic pain.  Unfortunately not a good candidate for NSAIDs due to long-term antiplatelet therapy and cardiovascular disease.  He remains very active.  Orders: Orders Placed This Encounter  Procedures   Ambulatory referral to Hand Surgery   No orders of the defined types were placed in this encounter.    Follow-Up  Instructions: Return if symptoms worsen or fail to improve.   Fuller Plan, MD  Note - This record has been created using [employer].  Chart creation errors have been sought, but may not always  have been located. Such creation errors do not reflect on  the standard of medical care.

## 2023-01-23 ENCOUNTER — Ambulatory Visit (HOSPITAL_COMMUNITY)
Admission: RE | Admit: 2023-01-23 | Discharge: 2023-01-23 | Disposition: A | Discharge status: Home / Self Care | Payer: 59 | Source: Ambulatory Visit | Attending: Internal Medicine | Admitting: Internal Medicine

## 2023-01-23 ENCOUNTER — Encounter (HOSPITAL_COMMUNITY): Payer: Self-pay

## 2023-01-23 ENCOUNTER — Encounter (HOSPITAL_COMMUNITY)
Admission: RE | Admit: 2023-01-23 | Discharge: 2023-01-23 | Disposition: A | Discharge status: Home / Self Care | Payer: 59 | Source: Ambulatory Visit | Attending: Internal Medicine | Admitting: Internal Medicine

## 2023-01-23 DIAGNOSIS — R9439 Abnormal result of other cardiovascular function study: Secondary | ICD-10-CM | POA: Insufficient documentation

## 2023-01-23 DIAGNOSIS — I2489 Other forms of acute ischemic heart disease: Secondary | ICD-10-CM | POA: Diagnosis not present

## 2023-01-23 DIAGNOSIS — Z0289 Encounter for other administrative examinations: Secondary | ICD-10-CM | POA: Diagnosis present

## 2023-01-23 LAB — NM MYOCAR MULTI W/SPECT W/WALL MOTION / EF
Angina Index: 0
Duke Treadmill Score: 4
Estimated workload: 6.1
Exercise duration (min): 4 min
Exercise duration (sec): 6 s
LV dias vol: 113 mL (ref 62–150)
LV sys vol: 68 mL
MPHR: 153 {beats}/min
Nuc Stress EF: 40 %
Peak HR: 133 {beats}/min
Percent HR: 86 %
RATE: 0.3
RPE: 12
Rest HR: 72 {beats}/min
Rest Nuclear Isotope Dose: 9.2 mCi
SDS: 8
SRS: 13
SSS: 21
ST Depression (mm): 0 mm
Stress Nuclear Isotope Dose: 28 mCi
TID: 1.44

## 2023-01-23 MED ORDER — TECHNETIUM TC 99M TETROFOSMIN IV KIT
30.0000 | PACK | Freq: Once | INTRAVENOUS | Status: AC | PRN
  Administered 2023-01-23: 28 via INTRAVENOUS

## 2023-01-23 MED ORDER — TECHNETIUM TC 99M TETROFOSMIN IV KIT
10.0000 | PACK | Freq: Once | INTRAVENOUS | Status: AC | PRN
  Administered 2023-01-23: 9.2 via INTRAVENOUS

## 2023-01-23 MED ORDER — REGADENOSON 0.4 MG/5ML IV SOLN
INTRAVENOUS | Status: AC
  Filled 2023-01-23: qty 5

## 2023-01-23 MED ORDER — SODIUM CHLORIDE FLUSH 0.9 % IV SOLN
INTRAVENOUS | Status: AC
  Administered 2023-01-23: 10 mL via INTRAVENOUS
  Filled 2023-01-23: qty 10

## 2023-01-24 ENCOUNTER — Telehealth: Payer: Self-pay

## 2023-01-24 DIAGNOSIS — I252 Old myocardial infarction: Secondary | ICD-10-CM

## 2023-01-24 DIAGNOSIS — I10 Essential (primary) hypertension: Secondary | ICD-10-CM

## 2023-01-24 NOTE — Telephone Encounter (Signed)
-----   Message from Vishnu P Mallipeddi sent at 01/24/2023  4:25 PM EST ----- Stress test is abnormal, Nuclear LVEF is 40%, will obtain Echo to confirm.

## 2023-01-24 NOTE — Telephone Encounter (Signed)
Patient informed and verbalized understanding of plan. 

## 2023-02-04 ENCOUNTER — Encounter: Payer: Self-pay | Admitting: Internal Medicine

## 2023-02-04 ENCOUNTER — Ambulatory Visit: Payer: 59 | Attending: Internal Medicine | Admitting: Internal Medicine

## 2023-02-04 VITALS — BP 122/77 | HR 73 | Resp 14 | Ht 70.0 in | Wt 211.0 lb

## 2023-02-04 DIAGNOSIS — M7711 Lateral epicondylitis, right elbow: Secondary | ICD-10-CM | POA: Diagnosis not present

## 2023-02-04 DIAGNOSIS — M65341 Trigger finger, right ring finger: Secondary | ICD-10-CM | POA: Diagnosis not present

## 2023-02-04 DIAGNOSIS — M2550 Pain in unspecified joint: Secondary | ICD-10-CM | POA: Diagnosis not present

## 2023-02-07 ENCOUNTER — Ambulatory Visit: Payer: 59 | Attending: Internal Medicine

## 2023-02-07 DIAGNOSIS — I10 Essential (primary) hypertension: Secondary | ICD-10-CM | POA: Diagnosis not present

## 2023-02-07 DIAGNOSIS — I252 Old myocardial infarction: Secondary | ICD-10-CM | POA: Diagnosis not present

## 2023-02-07 MED ORDER — PERFLUTREN LIPID MICROSPHERE
1.0000 mL | INTRAVENOUS | Status: AC | PRN
Start: 2023-02-07 — End: 2023-02-07
  Administered 2023-02-07: 9 mL via INTRAVENOUS

## 2023-02-08 ENCOUNTER — Ambulatory Visit: Payer: 59 | Attending: Internal Medicine | Admitting: Internal Medicine

## 2023-02-08 ENCOUNTER — Encounter: Payer: Self-pay | Admitting: Internal Medicine

## 2023-02-08 VITALS — BP 130/86 | HR 63 | Ht 71.0 in | Wt 214.0 lb

## 2023-02-08 DIAGNOSIS — R9439 Abnormal result of other cardiovascular function study: Secondary | ICD-10-CM | POA: Diagnosis not present

## 2023-02-08 MED ORDER — ATENOLOL 25 MG PO TABS: 25.0000 mg | ORAL_TABLET | Freq: Two times a day (BID) | ORAL | 3 refills | Status: DC

## 2023-02-08 NOTE — Patient Instructions (Addendum)
Medication Instructions:  Your physician has recommended you make the following change in your medication:  Stop metoprolol Restart atenolol 25 mg twice daily Continue all other medications as prescribed  Labwork: none  Testing/Procedures: none  Follow-Up: Your physician recommends that you schedule a follow-up appointment in: 6 months  Any Other Special Instructions Will Be Listed Below (If Applicable). Nursing staff will fax your office note to [facility] [place] fax # is [phone])  If you need a refill on your cardiac medications before your next appointment, please call your pharmacy.

## 2023-02-11 LAB — ECHOCARDIOGRAM COMPLETE
AR max vel: 2.34 cm2
AV Area VTI: 2.31 cm2
AV Area mean vel: 2.16 cm2
AV Mean grad: 4 mm[Hg]
AV Peak grad: 7.8 mm[Hg]
Ao pk vel: 1.4 m/s
Area-P 1/2: 4.01 cm2
Calc EF: 52 %
MV VTI: 2.03 cm2
S' Lateral: 3.8 cm
Single Plane A2C EF: 52.8 %
Single Plane A4C EF: 52.4 %

## 2023-02-11 NOTE — Progress Notes (Signed)
Cardiology Office Note  Date: 02/11/2023   ID: Luis Griffin, DOB [birth year], MRN [id number]  PCP:  Juliette Alcide, MD  Cardiologist:  Marjo Bicker, MD Electrophysiologist:  None    History of Present Illness: Luis Griffin is a 67 y.o. male known to have CAD s/p CABG in 2005, PCI in 2006, HTN, HLD, , adenocarcinoma of the right lower lobe of the lung s/p right lower lobe lobectomy in 11/2020, secondary polycythemia is here for follow-up visit.  Patient was initially referred to cardiology clinic for evaluation of chest pressure at rest in 06/2022 with negative troponins.  No angina.  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  He underwent exercise Myoview and is here to discuss results and next steps.  Overall doing great, no interval angina.  He has stable DOE for the last 2 years after he underwent lung surgery for lung cancer.  No recent worsening of DOE.  No orthopnea or PND.  No dizziness, presyncope and syncope.  No leg swelling.   Past Medical History:  Diagnosis Date   Coronary artery disease    cardiologsit-  dr Tinnie Gens clevenger   Depression    Frequency of urination    History of MI (myocardial infarction)    Hyperlipidemia    Hypertension    Lung cancer (HCC)    OA (osteoarthritis)    Productive cough    Prostate cancer (HCC) UROLOGIST-  DR MCKENZIE/  ONCOLOGIST-  DR MANNING   dx 02/ 2018,  Stage T1c,  PSA 5.0,  Gleason 4+3, vol 45cc   S/P drug eluting coronary stent placement    Smokers' cough (HCC)    Systemic lupus erythematosus (HCC)    Urgency of urination    Wears hearing aid in both ears     Past Surgical History:  Procedure Laterality Date   CARDIOVASCULAR STRESS TEST  06-03-2007   dr clevenger ([facility])   moderate sized fixed defect in the inferolateral wall probable scar (poss. attenuation artifact),  small area peri-infarct ischemia in the inferolateral region,  segment of inferolateral is  akinetic , septal motion consistant w/ post-op changes,  ef 65%,  no evidence significant ischemia   CORONARY ANGIOPLASTY WITH STENT PLACEMENT  2007  at [facility]   x2 DES per pt   CORONARY ARTERY BYPASS GRAFT  2006   [place]   x4 per pt   DOBUTAMINE STRESS ECHO  09/14/2015   dr j. cleveger ([facility])   normal stress echo-- moderate LVH, ef 55-60%, mild inferior wall hypokinesis,  mild AV sclerosis without stenosis,     LUNG CANCER SURGERY Right    PROSTATE BIOPSY  05/02/2016   RADIOACTIVE SEED IMPLANT N/A 08/17/2016   Procedure: RADIOACTIVE SEED IMPLANT/BRACHYTHERAPY IMPLANT;  Surgeon: Malen Gauze, MD;  Location: [facility];  Service: Urology;  Laterality: N/A;   TONSILLECTOMY  child   WRIST GANGLION EXCISION Right 2015    Current Outpatient Medications  Medication Sig Dispense Refill   aspirin 325 MG EC tablet Take 325 mg by mouth daily.     atenolol (TENORMIN) 25 MG tablet Take 1 tablet (25 mg total) by mouth 2 (two) times daily. 180 tablet 3   clopidogrel (PLAVIX) 75 MG tablet Take 75 mg by mouth daily.     diclofenac Sodium (VOLTAREN) 1 % GEL Place onto the skin as needed.     lisinopril (ZESTRIL) 20 MG tablet Take 20 mg by  mouth daily.     metFORMIN (GLUCOPHAGE) 500 MG tablet Take 500 mg by mouth 2 (two) times daily.     nitroGLYCERIN (NITROSTAT) 0.4 MG SL tablet Place 0.4 mg under the tongue every 5 (five) minutes x 3 doses as needed for chest pain (if no relief after 2nd dose, procee to ED or call 911).     rosuvastatin (CRESTOR) 20 MG tablet Take 1 tablet (20 mg total) by mouth at bedtime. 90 tablet 1   tamsulosin (FLOMAX) 0.4 MG CAPS capsule Take 1 capsule (0.4 mg total) by mouth daily after supper. 90 capsule 3   No current facility-administered medications for this visit.   Allergies:  Varenicline   Social History: The patient  reports that he quit smoking about 2 years ago. His smoking use included cigarettes. He started  smoking about 47 years ago. He has a 45 pack-year smoking history. He quit smokeless tobacco use about 2 years ago.  His smokeless tobacco use included snuff and chew. He reports current alcohol use. He reports that he does not use drugs.   Family History: The patient's family history includes Cancer in his father; Heart disease in his mother; Kidney cancer in his father; Lupus in his maternal grandfather and mother; Rheum arthritis in his maternal aunt; Thyroid disease in his mother.   ROS:  Please see the history of present illness. Otherwise, complete review of systems is positive for none.  All other systems are reviewed and negative.   Physical Exam: VS:  BP 130/86   Pulse 63   Ht 5\' 11"  (1.803 m)   Wt 214 lb (97.1 kg)   SpO2 93%   BMI 29.85 kg/m , BMI Body mass index is 29.85 kg/m.  Wt Readings from Last 3 Encounters:  02/08/23 214 lb (97.1 kg)  02/04/23 211 lb (95.7 kg)  01/09/23 212 lb 12.8 oz (96.5 kg)    General: Patient appears comfortable at rest. HEENT: Conjunctiva and lids normal, oropharynx clear with moist mucosa. Neck: Supple, no elevated JVP or carotid bruits, no thyromegaly. Lungs: Clear to auscultation, nonlabored breathing at rest. Cardiac: Regular rate and rhythm, no S3 or significant systolic murmur, no pericardial rub. Abdomen: Soft, nontender, no hepatomegaly, bowel sounds present, no guarding or rebound. Extremities: No pitting edema, distal pulses 2+. Skin: Warm and dry. Musculoskeletal: No kyphosis. Neuropsychiatric: Alert and oriented x3, affect grossly appropriate.  ECG: NSR, no ST changes  Recent Labwork: 06/26/2022: ALT 34; AST 21; BUN 24; Creatinine, Ser 1.00; Hemoglobin 16.7; Magnesium 2.0; Platelets 205; Potassium 4.2; Sodium 141  No results found for: "CHOL", "TRIG", "HDL", "CHOLHDL", "VLDL", "LDLCALC", "LDLDIRECT"  Other Studies Reviewed Today: I reviewed the [facility] notes, imaging studies.  Assessment and Plan:  CAD s/p CABG in 2005  at [facility] and PCI in 2006 at [facility] with normal LVEF, no interval angina HTN, controlled HLD /statin intolerance   -Patient is here for DOT physical, needs to get yearly checks.  He underwent exercise Myoview (manually adjusted treadmill speed due to dyspnea), exercised for 4 minutes 6 seconds achieving 6.10 METS. No angina during the test the test.  Due to dyspnea. Stress EKG was uninterpretable due to artifact. Nuclear imaging findings consistent with infarction in the inferior and inferolateral walls with peri-infarct ischemia in the inferolateral wall and mild ischemia in the anterolateral wall. Nuclear LVEF was 40%. I reviewed the echocardiogram performed on 02/07/2023 that showed LVEF 50 to 55%.  He reports having no angina in the last 1 year, although  he did have an ER visit in 06/2022 for chest pain but had negative troponins.  He continues to have DOE x 2 years after lung surgery but no recent worsening.  Although he did have a positive stress test, in the absence of symptoms, will defer any invasive ischemia evaluation at this time.  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  I provided him with the ER precautions for chest pain. -Currently on aspirin and Plavix, mainly for his for his polycythemia.  Has headaches with metoprolol, will switch to atenolol 25 mg twice daily, Imdur was self discontinued due to severe headaches. Continue rosuvastatin 20 mg nightly.   I have spent a total duration of 40 minutes filling employment clearance form, discussed echo and stress test results, discussed management plan and educated MI symptoms. ER precautions for chest pain provided.   Disposition:  Follow up  6 months  Signed, Daeron Carreno Verne Spurr, MD, 02/11/2023 9:13 AM    [facility] [address], [place], [place] [address]

## 2023-02-21 ENCOUNTER — Encounter: Payer: Self-pay | Admitting: Orthopaedic Surgery

## 2023-02-21 ENCOUNTER — Other Ambulatory Visit (INDEPENDENT_AMBULATORY_CARE_PROVIDER_SITE_OTHER): Payer: Self-pay

## 2023-02-21 ENCOUNTER — Ambulatory Visit: Payer: 59 | Admitting: Orthopaedic Surgery

## 2023-02-21 DIAGNOSIS — M25521 Pain in right elbow: Secondary | ICD-10-CM

## 2023-02-21 DIAGNOSIS — M65341 Trigger finger, right ring finger: Secondary | ICD-10-CM

## 2023-02-21 NOTE — Progress Notes (Signed)
Office Visit Note   Patient: Luis Griffin           Date of Birth: 04-17-1955           MRN: [id number] Visit Date: 02/21/2023              Requested by: Fuller Plan, MD [address] [place],  [place] [address] PCP: Juliette Alcide, MD   Assessment & Plan: Visit Diagnoses:  1. Pain in right elbow   2. Trigger finger, right ring finger     Plan: Luis Griffin is a 67 year old gentleman with right elbow lateral epicondylitis and right trigger finger of the ring finger.  We will provide him with a counterforce brace and I will send him to physical therapy in [place].  For the trigger finger he would like to have this surgically released in the near future.  W will call him to confirm surgery date.  Follow-Up Instructions: No follow-ups on file.   Orders:  Orders Placed This Encounter  Procedures   XR Elbow Complete Right (3+View)   No orders of the defined types were placed in this encounter.     Procedures: No procedures performed   Clinical Data: No additional findings.   Subjective: Chief Complaint  Patient presents with   Right Elbow - Pain   Right Hand - Pain    HPI Luis Griffin is a 67 year old gentleman here for evaluation of right lateral elbow pain and right trigger finger of the ring finger.  He is experienced locking for 2 years.  Had a cortisone injection in the past which was given a few months ago but the locking has returned.  He is right-hand dominant.  He also has pain to the lateral elbow for years.  Is worse with activity and grasping and lifting. Review of Systems  Constitutional: Negative.   HENT: Negative.    Eyes: Negative.   Respiratory: Negative.    Cardiovascular: Negative.   Gastrointestinal: Negative.   Endocrine: Negative.   Genitourinary: Negative.   Skin: Negative.   Allergic/Immunologic: Negative.   Neurological: Negative.   Hematological: Negative.   Psychiatric/Behavioral: Negative.    All other systems reviewed  and are negative.    Objective: Vital Signs: There were no vitals taken for this visit.  Physical Exam Vitals and nursing note reviewed.  Constitutional:      Appearance: He is well-developed.  HENT:     Head: Normocephalic and atraumatic.  Eyes:     Pupils: Pupils are equal, round, and reactive to light.  Pulmonary:     Effort: Pulmonary effort is normal.  Abdominal:     Palpations: Abdomen is soft.  Musculoskeletal:        General: Normal range of motion.     Cervical back: Neck supple.  Skin:    General: Skin is warm.  Neurological:     Mental Status: He is alert and oriented to person, place, and time.  Psychiatric:        Behavior: Behavior normal.        Thought Content: Thought content normal.        Judgment: Judgment normal.     Ortho Exam Exam of the right ring finger is consistent with locking and triggering. Exam of the right elbow shows tenderness to the lateral epicondyle.  No tenderness to the radial tunnel.  Negative Tinel.  Pain with resisted wrist extension. Specialty Comments:  No specialty comments available.  Imaging: XR Elbow Complete Right (3+View)  Result Date: 02/21/2023 X-rays of the right elbow show no acute abnormalities.  There is slight degenerative irregularity of the lateral epicondyle.    PMFS History: Patient Active Problem List   Diagnosis Date Noted   Lateral epicondylitis, right elbow 02/04/2023   HLD (hyperlipidemia) 07/02/2022   Encounter for insertion of venous access port 12/22/2020   Malignant neoplasm of lower lobe of right lung (HCC) 11/23/2020   Encounter for pre-employment examination 06/08/2020   Nephrolithiasis 04/27/2020   CAD (coronary artery disease) 04/08/2020   Lower abdominal pain 04/08/2020   Panlobular emphysema (HCC) 04/08/2020   Polycythemia 04/08/2020   Numbness and tingling in right hand 03/01/2020   Trigger finger, right ring finger 03/01/2020   Polyarthralgia 03/01/2020   Rash and other  nonspecific skin eruption 03/01/2020   Weak urinary stream 04/22/2019   Benign prostatic hyperplasia with urinary obstruction 04/22/2019   Old MI (myocardial infarction) 09/10/2016   Dysuria 09/07/2016   Prostate cancer (HCC) 05/30/2016   Internal hemorrhoids 01/08/2011   Depressive disorder 12/05/2009   Hypertension, benign 12/05/2009   Past Medical History:  Diagnosis Date   Coronary artery disease    cardiologsit-  dr Tinnie Gens clevenger   Depression    Frequency of urination    History of MI (myocardial infarction)    Hyperlipidemia    Hypertension    Lung cancer (HCC)    OA (osteoarthritis)    Productive cough    Prostate cancer (HCC) UROLOGIST-  DR MCKENZIE/  ONCOLOGIST-  DR MANNING   dx 02/ 2018,  Stage T1c,  PSA 5.0,  Gleason 4+3, vol 45cc   S/P drug eluting coronary stent placement    Smokers' cough (HCC)    Systemic lupus erythematosus (HCC)    Urgency of urination    Wears hearing aid in both ears     Family History  Problem Relation Age of Onset   Cancer Father        prostate ca   Kidney cancer Father    Heart disease Mother    Lupus Mother    Thyroid disease Mother    Rheum arthritis Maternal Aunt    Lupus Maternal Grandfather     Past Surgical History:  Procedure Laterality Date   CARDIOVASCULAR STRESS TEST  06-03-2007   dr clevenger ([facility])   moderate sized fixed defect in the inferolateral wall probable scar (poss. attenuation artifact),  small area peri-infarct ischemia in the inferolateral region,  segment of inferolateral is akinetic , septal motion consistant w/ post-op changes,  ef 65%,  no evidence significant ischemia   CORONARY ANGIOPLASTY WITH STENT PLACEMENT  2007  at [facility]   x2 DES per pt   CORONARY ARTERY BYPASS GRAFT  2006   [place]   x4 per pt   DOBUTAMINE STRESS ECHO  09/14/2015   dr j. cleveger ([facility])   normal stress echo-- moderate LVH, ef 55-60%, mild inferior wall hypokinesis,  mild AV sclerosis  without stenosis,     LUNG CANCER SURGERY Right    PROSTATE BIOPSY  05/02/2016   RADIOACTIVE SEED IMPLANT N/A 08/17/2016   Procedure: RADIOACTIVE SEED IMPLANT/BRACHYTHERAPY IMPLANT;  Surgeon: Malen Gauze, MD;  Location: [facility];  Service: Urology;  Laterality: N/A;   TONSILLECTOMY  child   WRIST GANGLION EXCISION Right 2015   Social History   Occupational History   Not on file  Tobacco Use   Smoking status: Former    Current packs/day: 0.00    Average  packs/day: 1 pack/day for 45.0 years (45.0 ttl pk-yrs)    Types: Cigarettes    Start date: 10/1975    Quit date: 10/2020    Years since quitting: 2.3   Smokeless tobacco: Former    Types: Snuff, [place]    Quit date: 09/2020  Vaping Use   Vaping status: Former  Substance and Sexual Activity   Alcohol use: Yes    Comment: seldom   Drug use: No   Sexual activity: Not on file

## 2023-03-22 ENCOUNTER — Telehealth: Payer: Self-pay | Admitting: Internal Medicine

## 2023-03-22 MED ORDER — CLOPIDOGREL BISULFATE 75 MG PO TABS: 75.0000 mg | ORAL_TABLET | Freq: Every day | ORAL | 11 refills | Status: DC

## 2023-03-22 MED ORDER — ROSUVASTATIN CALCIUM 20 MG PO TABS: 20.0000 mg | ORAL_TABLET | Freq: Every day | ORAL | 11 refills | Status: DC

## 2023-03-22 MED ORDER — LISINOPRIL 20 MG PO TABS: 20.0000 mg | ORAL_TABLET | Freq: Every day | ORAL | 11 refills | Status: DC

## 2023-03-22 NOTE — Telephone Encounter (Signed)
*  STAT* If patient is at the pharmacy, call can be transferred to refill team.   1. Which medications need to be refilled? (please list name of each medication and dose if known) lisinopril  (ZESTRIL ) 20 MG tablet rosuvastatin  (CRESTOR ) 20 MG tablet clopidogrel  (PLAVIX ) 75 MG tablet  2. Which pharmacy/location (including street and city if local pharmacy) is medication to be sent to? [id number] - [place], [place] - [address]   3. Do they need a 30 day or 90 day supply?  90 day supply

## 2023-03-22 NOTE — Telephone Encounter (Signed)
 Completed.

## 2023-04-08 ENCOUNTER — Telehealth: Payer: Self-pay | Admitting: Orthopaedic Surgery

## 2023-04-08 NOTE — Telephone Encounter (Signed)
Left message on patient's voicemail asking him to call so we can discuss surgery date for the trigger finger release with Dr. Roda Shutters.  Name and direct number provided.

## 2023-04-12 ENCOUNTER — Telehealth: Payer: Self-pay

## 2023-04-12 NOTE — Telephone Encounter (Addendum)
   Name: Luis Griffin  DOB: 28-Sep-1955  MRN: [id number]   Primary Cardiologist: Marjo Bicker, MD  Chart reviewed as part of pre-operative protocol coverage. Patient was contacted 04/12/2023 in reference to pre-operative risk assessment for pending surgery as outlined below.  BRENDYN MCLAREN was last seen on 02/08/2023 by Dr. Jenene Slicker.  Since that day, PEDER ALLUMS has done well from a cardiac standpoint.He denies any new symptoms or concerns. He is able to achieve >4 METS without difficulty.   Therefore, based on ACC/AHA guidelines, the patient would be at acceptable risk for the planned procedure without further cardiovascular testing.   Per office protocol, he may hold Plavix for 5 days prior to procedure. Please resume Plavix as soon as possible postprocedure, at the discretion of the surgeon.   Regarding ASA therapy, we recommend continuation of ASA throughout the perioperative period.  However, if the surgeon feels that cessation of ASA is required in the perioperative period, it may be stopped 5-7 days prior to surgery with a plan to resume it as soon as felt to be feasible from a surgical standpoint in the post-operative period.  The patient was advised that if he develops new symptoms prior to surgery to contact our office to arrange for a follow-up visit, and he verbalized understanding.  I will route this recommendation to the requesting party via Epic fax function and remove from pre-op pool. Please call with questions.  Joylene Grapes, NP 04/12/2023, 12:15 PM

## 2023-04-12 NOTE — Telephone Encounter (Signed)
   Pre-operative Risk Assessment    Patient Name: Luis Griffin  DOB: June 06, 1955 MRN: [id number]   Date of last office visit: 02/08/23 Date of next office visit: 08/21/23   Request for Surgical Clearance    Procedure:   right ring finger trigger release   Date of Surgery:  Clearance 04/18/23                                 Surgeon:  Cheral Almas, MD  Surgeon's Group or Practice Name:  [facility]  Phone number:  [phone] Fax number:  [phone]   Type of Clearance Requested:   - Medical  - Pharmacy:  Hold Aspirin and Clopidogrel (Plavix) stop Plavix 5-7 days    Type of Anesthesia:  Local and MAC   Additional requests/questions:    [name]   04/12/2023, 12:06 PM

## 2023-04-16 ENCOUNTER — Other Ambulatory Visit: Payer: Self-pay | Admitting: Physician Assistant

## 2023-04-16 MED ORDER — HYDROCODONE-ACETAMINOPHEN 5-325 MG PO TABS: 1.0000 | ORAL_TABLET | Freq: Three times a day (TID) | ORAL | 0 refills | Status: DC | PRN

## 2023-04-16 MED ORDER — ONDANSETRON HCL 4 MG PO TABS: 4.0000 mg | ORAL_TABLET | Freq: Three times a day (TID) | ORAL | 0 refills | Status: DC | PRN

## 2023-04-17 ENCOUNTER — Ambulatory Visit (HOSPITAL_COMMUNITY)
Admission: RE | Admit: 2023-04-17 | Discharge: 2023-04-17 | Disposition: A | Discharge status: Home / Self Care | Payer: Medicare Other | Source: Ambulatory Visit | Attending: Urology | Admitting: Urology

## 2023-04-17 DIAGNOSIS — N200 Calculus of kidney: Secondary | ICD-10-CM | POA: Diagnosis present

## 2023-04-18 ENCOUNTER — Other Ambulatory Visit: Payer: Self-pay | Admitting: Physician Assistant

## 2023-04-18 DIAGNOSIS — M65341 Trigger finger, right ring finger: Secondary | ICD-10-CM | POA: Diagnosis not present

## 2023-04-19 ENCOUNTER — Telehealth: Payer: Self-pay | Admitting: Orthopaedic Surgery

## 2023-04-19 NOTE — Telephone Encounter (Signed)
Patient called regarding his wrap on right hand

## 2023-04-22 ENCOUNTER — Other Ambulatory Visit: Payer: 59

## 2023-04-22 DIAGNOSIS — C61 Malignant neoplasm of prostate: Secondary | ICD-10-CM

## 2023-04-23 LAB — PSA: Prostate Specific Ag, Serum: <0.1 ng/mL (ref 0.0–4.0)

## 2023-04-26 ENCOUNTER — Ambulatory Visit (INDEPENDENT_AMBULATORY_CARE_PROVIDER_SITE_OTHER): Payer: 59 | Admitting: Orthopaedic Surgery

## 2023-04-26 DIAGNOSIS — M65341 Trigger finger, right ring finger: Secondary | ICD-10-CM

## 2023-04-26 NOTE — Progress Notes (Signed)
 Post-Op Visit Note   Patient: Luis Griffin           Date of Birth: Nov 09, 1955           MRN: [id number] Visit Date: 04/26/2023 PCP: Lari Elspeth BRAVO, MD   Assessment & Plan:  Chief Complaint:  Chief Complaint  Patient presents with   Right Hand - Follow-up    Ring trigger finger release 04/18/2023   Visit Diagnoses:  1. Trigger finger, right ring finger     Plan: Patient is a pleasant 68 year old gentleman who comes in today 1 week status post right ring trigger finger release, date of surgery 04/18/2023.  He has been doing great.  No pain or triggering.  Examination of the right hand reveals a well healing surgical incision with nylon sutures in place.  No evidence of infection or cellulitis.  Fingers warm well-perfused.  He is neurovascular intact distally.  Today, the wound was cleaned and recovered.  No heavy lifting or submerging his hand underwater for another 3 weeks.  Follow-up next week for suture removal.  Call with concerns or questions.  Follow-Up Instructions: Return in about 1 week (around 05/03/2023).   Orders:  No orders of the defined types were placed in this encounter.  No orders of the defined types were placed in this encounter.   Imaging: No new  PMFS History: Patient Active Problem List   Diagnosis Date Noted   Lateral epicondylitis, right elbow 02/04/2023   HLD (hyperlipidemia) 07/02/2022   Encounter for insertion of venous access port 12/22/2020   Malignant neoplasm of lower lobe of right lung (HCC) 11/23/2020   Encounter for pre-employment examination 06/08/2020   Nephrolithiasis 04/27/2020   CAD (coronary artery disease) 04/08/2020   Lower abdominal pain 04/08/2020   Panlobular emphysema (HCC) 04/08/2020   Polycythemia 04/08/2020   Numbness and tingling in right hand 03/01/2020   Trigger finger, right ring finger 03/01/2020   Polyarthralgia 03/01/2020   Rash and other nonspecific skin eruption 03/01/2020   Weak urinary stream  04/22/2019   Benign prostatic hyperplasia with urinary obstruction 04/22/2019   Old MI (myocardial infarction) 09/10/2016   Dysuria 09/07/2016   Prostate cancer (HCC) 05/30/2016   Internal hemorrhoids 01/08/2011   Depressive disorder 12/05/2009   Hypertension, benign 12/05/2009   Past Medical History:  Diagnosis Date   Coronary artery disease    cardiologsit-  dr reyes clevenger   Depression    Frequency of urination    History of MI (myocardial infarction)    Hyperlipidemia    Hypertension    Lung cancer (HCC)    OA (osteoarthritis)    Productive cough    Prostate cancer (HCC) UROLOGIST-  DR MCKENZIE/  ONCOLOGIST-  DR MANNING   dx 02/ 2018,  Stage T1c,  PSA 5.0,  Gleason 4+3, vol 45cc   S/P drug eluting coronary stent placement    Smokers' cough (HCC)    Systemic lupus erythematosus (HCC)    Urgency of urination    Wears hearing aid in both ears     Family History  Problem Relation Age of Onset   Cancer Father        prostate ca   Kidney cancer Father    Heart disease Mother    Lupus Mother    Thyroid  disease Mother    Rheum arthritis Maternal Aunt    Lupus Maternal Grandfather     Past Surgical History:  Procedure Laterality Date   CARDIOVASCULAR STRESS TEST  06-03-2007  dr clevenger ([facility])   moderate sized fixed defect in the inferolateral wall probable scar (poss. attenuation artifact),  small area peri-infarct ischemia in the inferolateral region,  segment of inferolateral is akinetic , septal motion consistant w/ post-op changes,  ef 65%,  no evidence significant ischemia   CORONARY ANGIOPLASTY WITH STENT PLACEMENT  2007  at [facility]   x2 DES per pt   CORONARY ARTERY BYPASS GRAFT  2006   [place]   x4 per pt   DOBUTAMINE STRESS ECHO  09/14/2015   dr j. cleveger ([facility])   normal stress echo-- moderate LVH, ef 55-60%, mild inferior wall hypokinesis,  mild AV sclerosis without stenosis,     LUNG CANCER SURGERY Right    PROSTATE  BIOPSY  05/02/2016   RADIOACTIVE SEED IMPLANT N/A 08/17/2016   Procedure: RADIOACTIVE SEED IMPLANT/BRACHYTHERAPY IMPLANT;  Surgeon: Sherrilee Belvie CROME, MD;  Location: [facility];  Service: Urology;  Laterality: N/A;   TONSILLECTOMY  child   WRIST GANGLION EXCISION Right 2015   Social History   Occupational History   Not on file  Tobacco Use   Smoking status: Former    Current packs/day: 0.00    Average packs/day: 1 pack/day for 45.0 years (45.0 ttl pk-yrs)    Types: Cigarettes    Start date: 10/1975    Quit date: 10/2020    Years since quitting: 2.5   Smokeless tobacco: Former    Types: Snuff, [place]    Quit date: 09/2020  Vaping Use   Vaping status: Former  Substance and Sexual Activity   Alcohol  use: Yes    Comment: seldom   Drug use: No   Sexual activity: Not on file

## 2023-04-29 ENCOUNTER — Ambulatory Visit (INDEPENDENT_AMBULATORY_CARE_PROVIDER_SITE_OTHER): Payer: 59 | Admitting: Urology

## 2023-04-29 VITALS — BP 104/69 | HR 56

## 2023-04-29 DIAGNOSIS — C61 Malignant neoplasm of prostate: Secondary | ICD-10-CM | POA: Diagnosis not present

## 2023-04-29 DIAGNOSIS — R3912 Poor urinary stream: Secondary | ICD-10-CM

## 2023-04-29 DIAGNOSIS — N200 Calculus of kidney: Secondary | ICD-10-CM | POA: Diagnosis not present

## 2023-04-29 DIAGNOSIS — N401 Enlarged prostate with lower urinary tract symptoms: Secondary | ICD-10-CM

## 2023-04-29 DIAGNOSIS — N138 Other obstructive and reflux uropathy: Secondary | ICD-10-CM

## 2023-04-29 MED ORDER — TAMSULOSIN HCL 0.4 MG PO CAPS: 0.4000 mg | ORAL_CAPSULE | Freq: Every day | ORAL | 3 refills | Status: DC

## 2023-04-29 NOTE — Patient Instructions (Signed)

## 2023-04-29 NOTE — Progress Notes (Signed)
04/29/2023 3:16 PM   Luis Griffin 05-15-1955 [id number]  Referring provider: Juliette Alcide, MD [address] [place],  [place] [address]  Followup nephrolithiasis and prostate cancer   HPI: Mr Luis Griffin is a 68yo here for followup for prostate cancer, BPH and nephrolithiasis. KUB from 2/5 shows no evidence of nephrolithiasis. PSA remains undetectable. IPSS 2 QOL 0 on flomax 0.4mg  daily. Nocturia 1x. Urine stream strong. No strainign to urinate. No other complaints today   PMH: Past Medical History:  Diagnosis Date   Coronary artery disease    cardiologsit-  dr Tinnie Gens clevenger   Depression    Frequency of urination    History of MI (myocardial infarction)    Hyperlipidemia    Hypertension    Lung cancer (HCC)    OA (osteoarthritis)    Productive cough    Prostate cancer (HCC) UROLOGIST-  DR Radin Raptis/  ONCOLOGIST-  DR MANNING   dx 02/ 2018,  Stage T1c,  PSA 5.0,  Gleason 4+3, vol 45cc   S/P drug eluting coronary stent placement    Smokers' cough (HCC)    Systemic lupus erythematosus (HCC)    Urgency of urination    Wears hearing aid in both ears     Surgical History: Past Surgical History:  Procedure Laterality Date   CARDIOVASCULAR STRESS TEST  06-03-2007   dr clevenger ([facility])   moderate sized fixed defect in the inferolateral wall probable scar (poss. attenuation artifact),  small area peri-infarct ischemia in the inferolateral region,  segment of inferolateral is akinetic , septal motion consistant w/ post-op changes,  ef 65%,  no evidence significant ischemia   CORONARY ANGIOPLASTY WITH STENT PLACEMENT  2007  at [facility]   x2 DES per pt   CORONARY ARTERY BYPASS GRAFT  2006   [place]   x4 per pt   DOBUTAMINE STRESS ECHO  06/68/2017   dr j. cleveger ([facility])   normal stress echo-- moderate LVH, ef 55-60%, mild inferior wall hypokinesis,  mild AV sclerosis without stenosis,     LUNG CANCER SURGERY Right    PROSTATE BIOPSY  05/02/2016    RADIOACTIVE SEED IMPLANT N/A 08/17/2016   Procedure: RADIOACTIVE SEED IMPLANT/BRACHYTHERAPY IMPLANT;  Surgeon: Malen Gauze, MD;  Location: [facility];  Service: Urology;  Laterality: N/A;   TONSILLECTOMY  child   WRIST GANGLION EXCISION Right 2015    Home Medications:  Allergies as of 04/29/2023       Reactions   Varenicline Other (See Comments)   Other reaction(s): Constipation Other reaction(s): Constipation Other reaction(s): Constipation        Medication List        Accurate as of April 29, 2023  3:16 PM. If you have any questions, ask your nurse or doctor.          aspirin EC 325 MG tablet Take 325 mg by mouth daily.   atenolol 25 MG tablet Commonly known as: TENORMIN Take 1 tablet (25 mg total) by mouth 2 (two) times daily.   clopidogrel 75 MG tablet Commonly known as: PLAVIX Take 1 tablet (75 mg total) by mouth daily.   diclofenac Sodium 1 % Gel Commonly known as: VOLTAREN Place onto the skin as needed.   HYDROcodone-acetaminophen 5-325 MG tablet Commonly known as: Norco Take 1 tablet by mouth 3 (three) times daily as needed. To be taken after surgery   lisinopril 20 MG tablet Commonly known as: ZESTRIL Take 1 tablet (20 mg total)  by mouth daily.   metFORMIN 500 MG tablet Commonly known as: GLUCOPHAGE Take 500 mg by mouth 2 (two) times daily.   nitroGLYCERIN 0.4 MG SL tablet Commonly known as: NITROSTAT Place 0.4 mg under the tongue every 5 (five) minutes x 3 doses as needed for chest pain (if no relief after 2nd dose, procee to ED or call 911).   ondansetron 4 MG tablet Commonly known as: Zofran Take 1 tablet (4 mg total) by mouth every 8 (eight) hours as needed for nausea or vomiting.   rosuvastatin 20 MG tablet Commonly known as: CRESTOR Take 1 tablet (20 mg total) by mouth at bedtime.   tamsulosin 0.4 MG Caps capsule Commonly known as: FLOMAX Take 1 capsule (0.4 mg total) by mouth daily after supper.         Allergies:  Allergies  Allergen Reactions   Varenicline Other (See Comments)    Other reaction(s): Constipation Other reaction(s): Constipation Other reaction(s): Constipation    Family History: Family History  Problem Relation Age of Onset   Cancer Father        prostate ca   Kidney cancer Father    Heart disease Mother    Lupus Mother    Thyroid disease Mother    Rheum arthritis Maternal Aunt    Lupus Maternal Grandfather     Social History:  reports that he quit smoking about 2 years ago. His smoking use included cigarettes. He started smoking about 47 years ago. He has a 45 pack-year smoking history. He quit smokeless tobacco use about 2 years ago.  His smokeless tobacco use included snuff and chew. He reports current alcohol use. He reports that he does not use drugs.  ROS: All other review of systems were reviewed and are negative except what is noted above in HPI  Physical Exam: BP 104/69   Pulse (!) 56   Constitutional:  Alert and oriented, No acute distress. HEENT: [place] AT, moist mucus membranes.  Trachea midline, no masses. Cardiovascular: No clubbing, cyanosis, or edema. Respiratory: Normal respiratory effort, no increased work of breathing. GI: Abdomen is soft, nontender, nondistended, no abdominal masses GU: No CVA tenderness.  Lymph: No cervical or inguinal lymphadenopathy. Skin: No rashes, bruises or suspicious lesions. Neurologic: Grossly intact, no focal deficits, moving all 4 extremities. Psychiatric: Normal mood and affect.  Laboratory Data: Lab Results  Component Value Date   WBC 7.7 06/26/2022   HGB 16.7 06/26/2022   HCT 49.0 06/26/2022   MCV 89.5 06/26/2022   PLT 205 06/26/2022    Lab Results  Component Value Date   CREATININE 1.00 06/26/2022    No results found for: "PSA"  No results found for: "TESTOSTERONE"  No results found for: "HGBA1C"  Urinalysis    Component Value Date/Time   COLORURINE YELLOW 04/11/2016 1600    APPEARANCEUR Clear 04/30/2022 1121   LABSPEC 1.010 09/07/2016 1116   PHURINE 6.0 09/07/2016 1116   PHURINE 5.0 04/11/2016 1600   GLUCOSEU Negative 04/30/2022 1121   GLUCOSEU Negative 09/07/2016 1116   HGBUR Small 09/07/2016 1116   HGBUR MODERATE (A) 04/11/2016 1600   BILIRUBINUR Negative 04/30/2022 1121   BILIRUBINUR Negative 09/07/2016 1116   KETONESUR Negative 09/07/2016 1116   KETONESUR NEGATIVE 04/11/2016 1600   PROTEINUR Negative 04/30/2022 1121   PROTEINUR Negative 09/07/2016 1116   PROTEINUR NEGATIVE 04/11/2016 1600   UROBILINOGEN 0.2 09/07/2016 1116   NITRITE Negative 04/30/2022 1121   NITRITE Negative 09/07/2016 1116   NITRITE NEGATIVE 04/11/2016 1600   LEUKOCYTESUR  Negative 04/30/2022 1121   LEUKOCYTESUR Negative 09/07/2016 1116    Lab Results  Component Value Date   LABMICR Comment 04/30/2022   WBCUA None seen 04/27/2020   LABEPIT 0-10 04/27/2020   MUCUS Present 04/27/2020   BACTERIA None seen 04/27/2020    Pertinent Imaging: KUb 04/24/23: Images reviewed and discussed with the patient  Results for orders placed during the hospital encounter of 04/17/23  Abdomen 1 view (KUB)  Narrative CLINICAL DATA:  Nephrolithiasis, follow-up kidney stones.  EXAM: ABDOMEN - 1 VIEW  COMPARISON:  04/17/2022  FINDINGS: Previous left renal stone is not definitively seen, may not be included in the field of view. There is a left pelvic phlebolith. Brachytherapy seeds in the prostate. Normal bowel gas pattern, small volume of stool in the colon.  IMPRESSION: Previous left renal stone is not definitively seen.   Electronically Signed By: Narda Rutherford M.D. On: 04/24/2023 16:14  No results found for this or any previous visit.  No results found for this or any previous visit.  No results found for this or any previous visit.  Results for orders placed during the hospital encounter of 09/18/17  US RENAL  Narrative CLINICAL DATA:  Follow-up examination for  right-sided ureteral calculus.  EXAM: RENAL / URINARY TRACT ULTRASOUND COMPLETE  COMPARISON:  Prior CT from 05/23/2016.  FINDINGS: Right Kidney:  Length: 15.6 cm. Echogenicity within normal limits. No hydronephrosis. 1.0 x 0.9 x 1.0 cm minimally complex cyst present within the interpolar right kidney grossly similar to previous. No shadowing echogenic calculi identified.  Left Kidney:  Length: 13.9 cm. Echogenicity within normal limits. No mass or hydronephrosis visualized. No shadowing echogenic calculi identified.  Bladder:  Appears normal for degree of bladder distention. Bilateral ureteral jets identified.  IMPRESSION: 1. No hydronephrosis.  No sonographic evidence for nephrolithiasis. 2. 1 cm minimally complex right renal cyst. Finding is almost certainly benign, and grossly similar as compared to prior CT from 2018.   Electronically Signed By: Rise Mu M.D. On: 09/18/2017 17:45  No results found for this or any previous visit.  No results found for this or any previous visit.  No results found for this or any previous visit.   Assessment & Plan:    1. Nephrolithiasis (Primary) Followup 1 year with KUB - Urinalysis, Routine w reflex microscopic  2. Weak urinary stream -flomax 0.4mg  daily  3. Prostate cancer (HCC) Followup 1 year with a PSA  4. Benign prostatic hyperplasia with urinary obstruction Flomax 0.4mg  daily   No follow-ups on file.  Wilkie Aye, MD  [facility] [place]

## 2023-04-30 LAB — URINALYSIS, ROUTINE W REFLEX MICROSCOPIC
Bilirubin, UA: NEGATIVE
Ketones, UA: NEGATIVE
Leukocytes,UA: NEGATIVE
Nitrite, UA: NEGATIVE
Protein,UA: NEGATIVE
Specific Gravity, UA: 1.025 (ref 1.005–1.030)
Urobilinogen, Ur: 0.2 mg/dL (ref 0.2–1.0)
pH, UA: 6 (ref 5.0–7.5)

## 2023-04-30 LAB — MICROSCOPIC EXAMINATION
Bacteria, UA: NONE SEEN
WBC, UA: NONE SEEN /[HPF] (ref 0–5)

## 2023-05-03 ENCOUNTER — Ambulatory Visit: Payer: 59 | Admitting: Orthopaedic Surgery

## 2023-05-03 ENCOUNTER — Encounter: Payer: Self-pay | Admitting: Orthopaedic Surgery

## 2023-05-03 DIAGNOSIS — M65341 Trigger finger, right ring finger: Secondary | ICD-10-CM

## 2023-05-03 NOTE — Progress Notes (Signed)
Post-Op Visit Note   Patient: Luis Griffin           Date of Birth: 29-Jul-1955           MRN: [id number] Visit Date: 05/03/2023 PCP: Juliette Alcide, MD   Assessment & Plan:  Chief Complaint:  Chief Complaint  Patient presents with   Right Hand - Follow-up    Ring trigger finger release 04/18/2023   Visit Diagnoses:  1. Trigger finger, right ring finger     Plan: Patient is a pleasant 68 year old gentleman who comes in today 2 weeks status post right ring trigger finger release 04/18/2023.  He has been doing great.  No pain or triggering.  Examination of the right hand reveals a fully healed surgical incision with nylon sutures in place.  No evidence of infection or cellulitis.  Fingers warm well-perfused.  He is neuro bascule intact distally.  Today, the sutures were removed Steri-Strips applied.  No heavy lifting for another 2 weeks.  Follow-up in 2 weeks for recheck.  Call with concerns or questions.  Follow-Up Instructions: Return in about 2 weeks (around 05/17/2023).   Orders:  No orders of the defined types were placed in this encounter.  No orders of the defined types were placed in this encounter.   Imaging: No new imaging  PMFS History: Patient Active Problem List   Diagnosis Date Noted   Lateral epicondylitis, right elbow 02/04/2023   HLD (hyperlipidemia) 07/02/2022   Encounter for insertion of venous access port 12/22/2020   Malignant neoplasm of lower lobe of right lung (HCC) 11/23/2020   Encounter for pre-employment examination 06/08/2020   Nephrolithiasis 04/27/2020   CAD (coronary artery disease) 04/08/2020   Lower abdominal pain 04/08/2020   Panlobular emphysema (HCC) 04/08/2020   Polycythemia 04/08/2020   Numbness and tingling in right hand 03/01/2020   Trigger finger, right ring finger 03/01/2020   Polyarthralgia 03/01/2020   Rash and other nonspecific skin eruption 03/01/2020   Weak urinary stream 04/22/2019   Benign prostatic hyperplasia  with urinary obstruction 04/22/2019   Old MI (myocardial infarction) 09/10/2016   Dysuria 09/07/2016   Prostate cancer (HCC) 05/30/2016   Internal hemorrhoids 01/08/2011   Depressive disorder 12/05/2009   Hypertension, benign 12/05/2009   Past Medical History:  Diagnosis Date   Coronary artery disease    cardiologsit-  dr Tinnie Gens clevenger   Depression    Frequency of urination    History of MI (myocardial infarction)    Hyperlipidemia    Hypertension    Lung cancer (HCC)    OA (osteoarthritis)    Productive cough    Prostate cancer (HCC) UROLOGIST-  DR MCKENZIE/  ONCOLOGIST-  DR MANNING   dx 02/ 2018,  Stage T1c,  PSA 5.0,  Gleason 4+3, vol 45cc   S/P drug eluting coronary stent placement    Smokers' cough (HCC)    Systemic lupus erythematosus (HCC)    Urgency of urination    Wears hearing aid in both ears     Family History  Problem Relation Age of Onset   Cancer Father        prostate ca   Kidney cancer Father    Heart disease Mother    Lupus Mother    Thyroid disease Mother    Rheum arthritis Maternal Aunt    Lupus Maternal Grandfather     Past Surgical History:  Procedure Laterality Date   CARDIOVASCULAR STRESS TEST  06-03-2007   dr clevenger ([facility])   moderate  sized fixed defect in the inferolateral wall probable scar (poss. attenuation artifact),  small area peri-infarct ischemia in the inferolateral region,  segment of inferolateral is akinetic , septal motion consistant w/ post-op changes,  ef 65%,  no evidence significant ischemia   CORONARY ANGIOPLASTY WITH STENT PLACEMENT  2007  at [facility]   x2 DES per pt   CORONARY ARTERY BYPASS GRAFT  2006   [place]   x4 per pt   DOBUTAMINE STRESS ECHO  09/14/2015   dr j. cleveger ([facility])   normal stress echo-- moderate LVH, ef 55-60%, mild inferior wall hypokinesis,  mild AV sclerosis without stenosis,     LUNG CANCER SURGERY Right    PROSTATE BIOPSY  05/02/2016   RADIOACTIVE SEED IMPLANT  N/A 08/17/2016   Procedure: RADIOACTIVE SEED IMPLANT/BRACHYTHERAPY IMPLANT;  Surgeon: Malen Gauze, MD;  Location: [facility];  Service: Urology;  Laterality: N/A;   TONSILLECTOMY  child   WRIST GANGLION EXCISION Right 2015   Social History   Occupational History   Not on file  Tobacco Use   Smoking status: Former    Current packs/day: 0.00    Average packs/day: 1 pack/day for 45.0 years (45.0 ttl pk-yrs)    Types: Cigarettes    Start date: 10/1975    Quit date: 10/2020    Years since quitting: 2.5   Smokeless tobacco: Former    Types: Snuff, Dorna Bloom    Quit date: 09/2020  Vaping Use   Vaping status: Former  Substance and Sexual Activity   Alcohol use: Yes    Comment: seldom   Drug use: No   Sexual activity: Not on file

## 2023-05-07 ENCOUNTER — Encounter: Payer: Self-pay | Admitting: Urology

## 2023-05-17 ENCOUNTER — Ambulatory Visit: Payer: Medicare Other | Admitting: Physician Assistant

## 2023-05-17 DIAGNOSIS — M65341 Trigger finger, right ring finger: Secondary | ICD-10-CM

## 2023-05-17 NOTE — Progress Notes (Signed)
 Post-Op Visit Note   Patient: Luis Griffin           Date of Birth: 1955-11-24           MRN: [id number] Visit Date: 05/17/2023 PCP: Juliette Alcide, MD   Assessment & Plan:  Chief Complaint:  Chief Complaint  Patient presents with   Right Ring Finger - Routine Post Op, Follow-up   Visit Diagnoses:  1. Trigger finger, right ring finger     Plan: Patient is a pleasant 68 year old gentleman who comes in today approximately 4 weeks status post right ring trigger finger release 04/18/2023.  He has been doing well.  No pain or triggering.  He notes a little stiffness at times.  He has been working on getting his strength back.  Examination of his right hand: Fully healed surgical scar without complication.  No reproducible triggering.  He is neurovascularly intact distally.  At this point, he will continue to advance with activity as tolerated.  Follow-up as needed.  Follow-Up Instructions: Return if symptoms worsen or fail to improve.   Orders:  No orders of the defined types were placed in this encounter.  No orders of the defined types were placed in this encounter.   Imaging: No new imaging  PMFS History: Patient Active Problem List   Diagnosis Date Noted   Lateral epicondylitis, right elbow 02/04/2023   HLD (hyperlipidemia) 07/02/2022   Encounter for insertion of venous access port 12/22/2020   Malignant neoplasm of lower lobe of right lung (HCC) 11/23/2020   Encounter for pre-employment examination 06/08/2020   Nephrolithiasis 04/27/2020   CAD (coronary artery disease) 04/08/2020   Lower abdominal pain 04/08/2020   Panlobular emphysema (HCC) 04/08/2020   Polycythemia 04/08/2020   Numbness and tingling in right hand 03/01/2020   Trigger finger, right ring finger 03/01/2020   Polyarthralgia 03/01/2020   Rash and other nonspecific skin eruption 03/01/2020   Weak urinary stream 04/22/2019   Benign prostatic hyperplasia with urinary obstruction 04/22/2019   Old  MI (myocardial infarction) 09/10/2016   Dysuria 09/07/2016   Prostate cancer (HCC) 05/30/2016   Internal hemorrhoids 01/08/2011   Depressive disorder 12/05/2009   Hypertension, benign 12/05/2009   Past Medical History:  Diagnosis Date   Coronary artery disease    cardiologsit-  dr Tinnie Gens clevenger   Depression    Frequency of urination    History of MI (myocardial infarction)    Hyperlipidemia    Hypertension    Lung cancer (HCC)    OA (osteoarthritis)    Productive cough    Prostate cancer (HCC) UROLOGIST-  DR MCKENZIE/  ONCOLOGIST-  DR MANNING   dx 02/ 2018,  Stage T1c,  PSA 5.0,  Gleason 4+3, vol 45cc   S/P drug eluting coronary stent placement    Smokers' cough (HCC)    Systemic lupus erythematosus (HCC)    Urgency of urination    Wears hearing aid in both ears     Family History  Problem Relation Age of Onset   Cancer Father        prostate ca   Kidney cancer Father    Heart disease Mother    Lupus Mother    Thyroid disease Mother    Rheum arthritis Maternal Aunt    Lupus Maternal Grandfather     Past Surgical History:  Procedure Laterality Date   CARDIOVASCULAR STRESS TEST  06-03-2007   dr clevenger ([facility])   moderate sized fixed defect in the inferolateral wall probable scar (  poss. attenuation artifact),  small area peri-infarct ischemia in the inferolateral region,  segment of inferolateral is akinetic , septal motion consistant w/ post-op changes,  ef 65%,  no evidence significant ischemia   CORONARY ANGIOPLASTY WITH STENT PLACEMENT  2007  at [facility]   x2 DES per pt   CORONARY ARTERY BYPASS GRAFT  2006   [place]   x4 per pt   DOBUTAMINE STRESS ECHO  09/14/2015   dr j. cleveger ([facility])   normal stress echo-- moderate LVH, ef 55-60%, mild inferior wall hypokinesis,  mild AV sclerosis without stenosis,     LUNG CANCER SURGERY Right    PROSTATE BIOPSY  05/02/2016   RADIOACTIVE SEED IMPLANT N/A 08/17/2016   Procedure: RADIOACTIVE  SEED IMPLANT/BRACHYTHERAPY IMPLANT;  Surgeon: Malen Gauze, MD;  Location: [facility];  Service: Urology;  Laterality: N/A;   TONSILLECTOMY  child   WRIST GANGLION EXCISION Right 2015   Social History   Occupational History   Not on file  Tobacco Use   Smoking status: Former    Current packs/day: 0.00    Average packs/day: 1 pack/day for 45.0 years (45.0 ttl pk-yrs)    Types: Cigarettes    Start date: 10/1975    Quit date: 10/2020    Years since quitting: 2.5   Smokeless tobacco: Former    Types: Snuff, Dorna Bloom    Quit date: 09/2020  Vaping Use   Vaping status: Former  Substance and Sexual Activity   Alcohol use: Yes    Comment: seldom   Drug use: No   Sexual activity: Not on file

## 2023-06-08 ENCOUNTER — Other Ambulatory Visit: Payer: Self-pay | Admitting: Urology

## 2023-06-08 DIAGNOSIS — R3912 Poor urinary stream: Secondary | ICD-10-CM

## 2023-08-21 ENCOUNTER — Encounter: Payer: Self-pay | Admitting: Internal Medicine

## 2023-08-21 ENCOUNTER — Ambulatory Visit: Payer: 59 | Attending: Internal Medicine | Admitting: Internal Medicine

## 2023-08-21 VITALS — BP 108/80 | HR 59 | Ht 70.5 in | Wt 212.2 lb

## 2023-08-21 DIAGNOSIS — Z0289 Encounter for other administrative examinations: Secondary | ICD-10-CM

## 2023-08-21 DIAGNOSIS — I10 Essential (primary) hypertension: Secondary | ICD-10-CM

## 2023-08-21 MED ORDER — ROSUVASTATIN CALCIUM 20 MG PO TABS: 20.0000 mg | ORAL_TABLET | Freq: Every day | ORAL | 11 refills | Status: AC

## 2023-08-21 NOTE — Patient Instructions (Signed)
 Medication Instructions:  Your physician recommends that you continue on your current medications as directed. Please refer to the Current Medication list given to you today.  *If you need a refill on your cardiac medications before your next appointment, please call your pharmacy*  Lab Work: None If you have labs (blood work) drawn today and your tests are completely normal, you will receive your results only by: MyChart Message (if you have MyChart) OR A paper copy in the mail If you have any lab test that is abnormal or we need to change your treatment, we will call you to review the results.  Testing/Procedures: None  Follow-Up: At [facility], you and your health needs are our priority.  As part of our continuing mission to provide you with exceptional heart care, our providers are all part of one team.  This team includes your primary Cardiologist (physician) and Advanced Practice Providers or APPs (Physician Assistants and Nurse Practitioners) who all work together to provide you with the care you need, when you need it.  Your next appointment:   1 year(s)  Provider:   You may see Vishnu P Mallipeddi, MD or one of the following Advanced Practice Providers on your designated Care Team:   [place], PA-C  Sc[place], [place] Jacolyn Reedy, [place]     We recommend signing up for the patient portal called "MyChart".  Sign up information is provided on this After Visit Summary.  MyChart is used to connect with patients for Virtual Visits (Telemedicine).  Patients are able to view lab/test results, encounter notes, upcoming appointments, etc.  Non-urgent messages can be sent to your provider as well.   To learn more about what you can do with MyChart, go to [email or web address].   Other Instructions

## 2023-08-21 NOTE — Progress Notes (Signed)
 Cardiology Office Note  Date: 08/21/2023   ID: Luis Griffin, DOB Sep 09, 1955, MRN [id number]  PCP:  Alston Jerry, MD  Cardiologist:  Lasalle Pointer, MD Electrophysiologist:  None    History of Present Illness: Luis Griffin is a 68 y.o. male known to have CAD s/p CABG in 2005, PCI in 2006, HTN, HLD, , adenocarcinoma of the right lower lobe of the lung s/p right lower lobe lobectomy in 11/2020, secondary polycythemia is here for follow-up visit.  Patient was initially referred to cardiology clinic for evaluation of chest pressure at rest in 06/2022 with negative troponins.  No angina.  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  He is here today for follow-up visit.  No angina.  Occasional SOB.  No dizziness, palpitations, syncope or leg swelling.   Past Medical History:  Diagnosis Date   Coronary artery disease    cardiologsit-  dr Susana Enter clevenger   Depression    Frequency of urination    History of MI (myocardial infarction)    Hyperlipidemia    Hypertension    Lung cancer (HCC)    OA (osteoarthritis)    Productive cough    Prostate cancer (HCC) UROLOGIST-  DR MCKENZIE/  ONCOLOGIST-  DR MANNING   dx 02/ 2018,  Stage T1c,  PSA 5.0,  Gleason 4+3, vol 45cc   S/P drug eluting coronary stent placement    Smokers' cough (HCC)    Systemic lupus erythematosus (HCC)    Urgency of urination    Wears hearing aid in both ears     Past Surgical History:  Procedure Laterality Date   CARDIOVASCULAR STRESS TEST  06-03-2007   dr clevenger ([facility])   moderate sized fixed defect in the inferolateral wall probable scar (poss. attenuation artifact),  small area peri-infarct ischemia in the inferolateral region,  segment of inferolateral is akinetic , septal motion consistant w/ post-op changes,  ef 65%,  no evidence significant ischemia   CORONARY ANGIOPLASTY WITH STENT PLACEMENT  2007  at [facility]   x2 DES per pt   CORONARY  ARTERY BYPASS GRAFT  2006   [place]   x4 per pt   DOBUTAMINE STRESS ECHO  09/14/2015   dr j. cleveger ([facility])   normal stress echo-- moderate LVH, ef 55-60%, mild inferior wall hypokinesis,  mild AV sclerosis without stenosis,     LUNG CANCER SURGERY Right    PROSTATE BIOPSY  05/02/2016   RADIOACTIVE SEED IMPLANT N/A 08/17/2016   Procedure: RADIOACTIVE SEED IMPLANT/BRACHYTHERAPY IMPLANT;  Surgeon: Marco Severs, MD;  Location: [facility];  Service: Urology;  Laterality: N/A;   TONSILLECTOMY  child   WRIST GANGLION EXCISION Right 2015    Current Outpatient Medications  Medication Sig Dispense Refill   albuterol  (VENTOLIN  HFA) 108 (90 Base) MCG/ACT inhaler Inhale 2 puffs into the lungs every 6 (six) hours as needed for shortness of breath.     aspirin 325 MG EC tablet Take 325 mg by mouth daily.     atenolol  (TENORMIN ) 25 MG tablet Take 1 tablet (25 mg total) by mouth 2 (two) times daily. 180 tablet 3   clopidogrel  (PLAVIX ) 75 MG tablet Take 1 tablet (75 mg total) by mouth daily. 30 tablet 11   diclofenac Sodium (VOLTAREN) 1 % GEL Place onto the skin as needed.     lisinopril  (ZESTRIL ) 20 MG tablet Take 1 tablet (20 mg total) by mouth daily. 30  tablet 11   metFORMIN (GLUCOPHAGE) 500 MG tablet Take 500 mg by mouth 2 (two) times daily.     nitroGLYCERIN (NITROSTAT) 0.4 MG SL tablet Place 0.4 mg under the tongue every 5 (five) minutes x 3 doses as needed for chest pain (if no relief after 2nd dose, procee to ED or call 911).     tamsulosin  (FLOMAX ) 0.4 MG CAPS capsule TAKE 1 CAPSULE DAILY AFTER SUPPER 90 capsule 3   HYDROcodone -acetaminophen  (NORCO) 5-325 MG tablet Take 1 tablet by mouth 3 (three) times daily as needed. To be taken after surgery (Patient not taking: Reported on 08/21/2023) 21 tablet 0   rosuvastatin  (CRESTOR ) 20 MG tablet Take 1 tablet (20 mg total) by mouth at bedtime. 30 tablet 11   No current facility-administered medications for this  visit.   Allergies:  Varenicline   Social History: The patient  reports that he quit smoking about 2 years ago. His smoking use included cigarettes. He started smoking about 47 years ago. He has a 45 pack-year smoking history. He quit smokeless tobacco use about 2 years ago.  His smokeless tobacco use included snuff and chew. He reports current alcohol  use. He reports that he does not use drugs.   Family History: The patient's family history includes Cancer in his father; Heart disease in his mother; Kidney cancer in his father; Lupus in his maternal grandfather and mother; Rheum arthritis in his maternal aunt; Thyroid  disease in his mother.   ROS:  Please see the history of present illness. Otherwise, complete review of systems is positive for none.  All other systems are reviewed and negative.   Physical Exam: VS:  BP 108/80 (BP Location: Left Arm, Patient Position: Sitting)   Pulse (!) 59   Ht 5' 10.5" (1.791 m)   Wt 212 lb 3.2 oz (96.3 kg)   SpO2 94%   BMI 30.02 kg/m , BMI Body mass index is 30.02 kg/m.  Wt Readings from Last 3 Encounters:  08/21/23 212 lb 3.2 oz (96.3 kg)  02/08/23 214 lb (97.1 kg)  02/04/23 211 lb (95.7 kg)    General: Patient appears comfortable at rest. HEENT: Conjunctiva and lids normal, oropharynx clear with moist mucosa. Neck: Supple, no elevated JVP or carotid bruits, no thyromegaly. Lungs: Clear to auscultation, nonlabored breathing at rest. Cardiac: Regular rate and rhythm, no S3 or significant systolic murmur, no pericardial rub. Abdomen: Soft, nontender, no hepatomegaly, bowel sounds present, no guarding or rebound. Extremities: No pitting edema, distal pulses 2+. Skin: Warm and dry. Musculoskeletal: No kyphosis. Neuropsychiatric: Alert and oriented x3, affect grossly appropriate.  ECG: NSR, no ST changes  Recent Labwork: No results found for requested labs within last 365 days.  No results found for: "CHOL", "TRIG", "HDL", "CHOLHDL", "VLDL",  "LDLCALC", "LDLDIRECT"  Other Studies Reviewed Today: I reviewed the [facility] notes, imaging studies.  Assessment and Plan:  CAD s/p CABG in 2005 at [facility] and PCI in 2006 at [facility] with normal LVEF, no interval angina: No interval angina or DOE.  Although he did have occasional DOE could be from pneumonectomy in the past. Continue aspirin and rosuvastatin .  Refill rosuvastatin .  On Plavix  for polycythemia not for CAD.  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  HTN, controlled: Continue current antihypertensives, lisinopril  20 mg once daily.  Did not tolerate metoprolol  in the past due to headaches.  Did not tolerate Imdur  due to headaches.  Continue atenolol .  HLD /statin intolerance: Previously had  statin intolerance/myopathy due to statins but currently tolerating rosuvastatin  20 mg nightly with no myalgias.  Will continue.   Disposition:  Follow up 1 year  Signed, Caydon Feasel Beauford Bounds, MD, 08/21/2023 9:59 AM    [facility] [address], [place], [place] [address]

## 2023-09-21 IMAGING — DX DG ABDOMEN 1V
1 series · 1 of 1 positions shown · non-contrast
Comparison: PET-CT 05/16/2020

CLINICAL DATA: Nephrolithiasis

EXAM:
ABDOMEN - 1 VIEW

[abdomen kub]
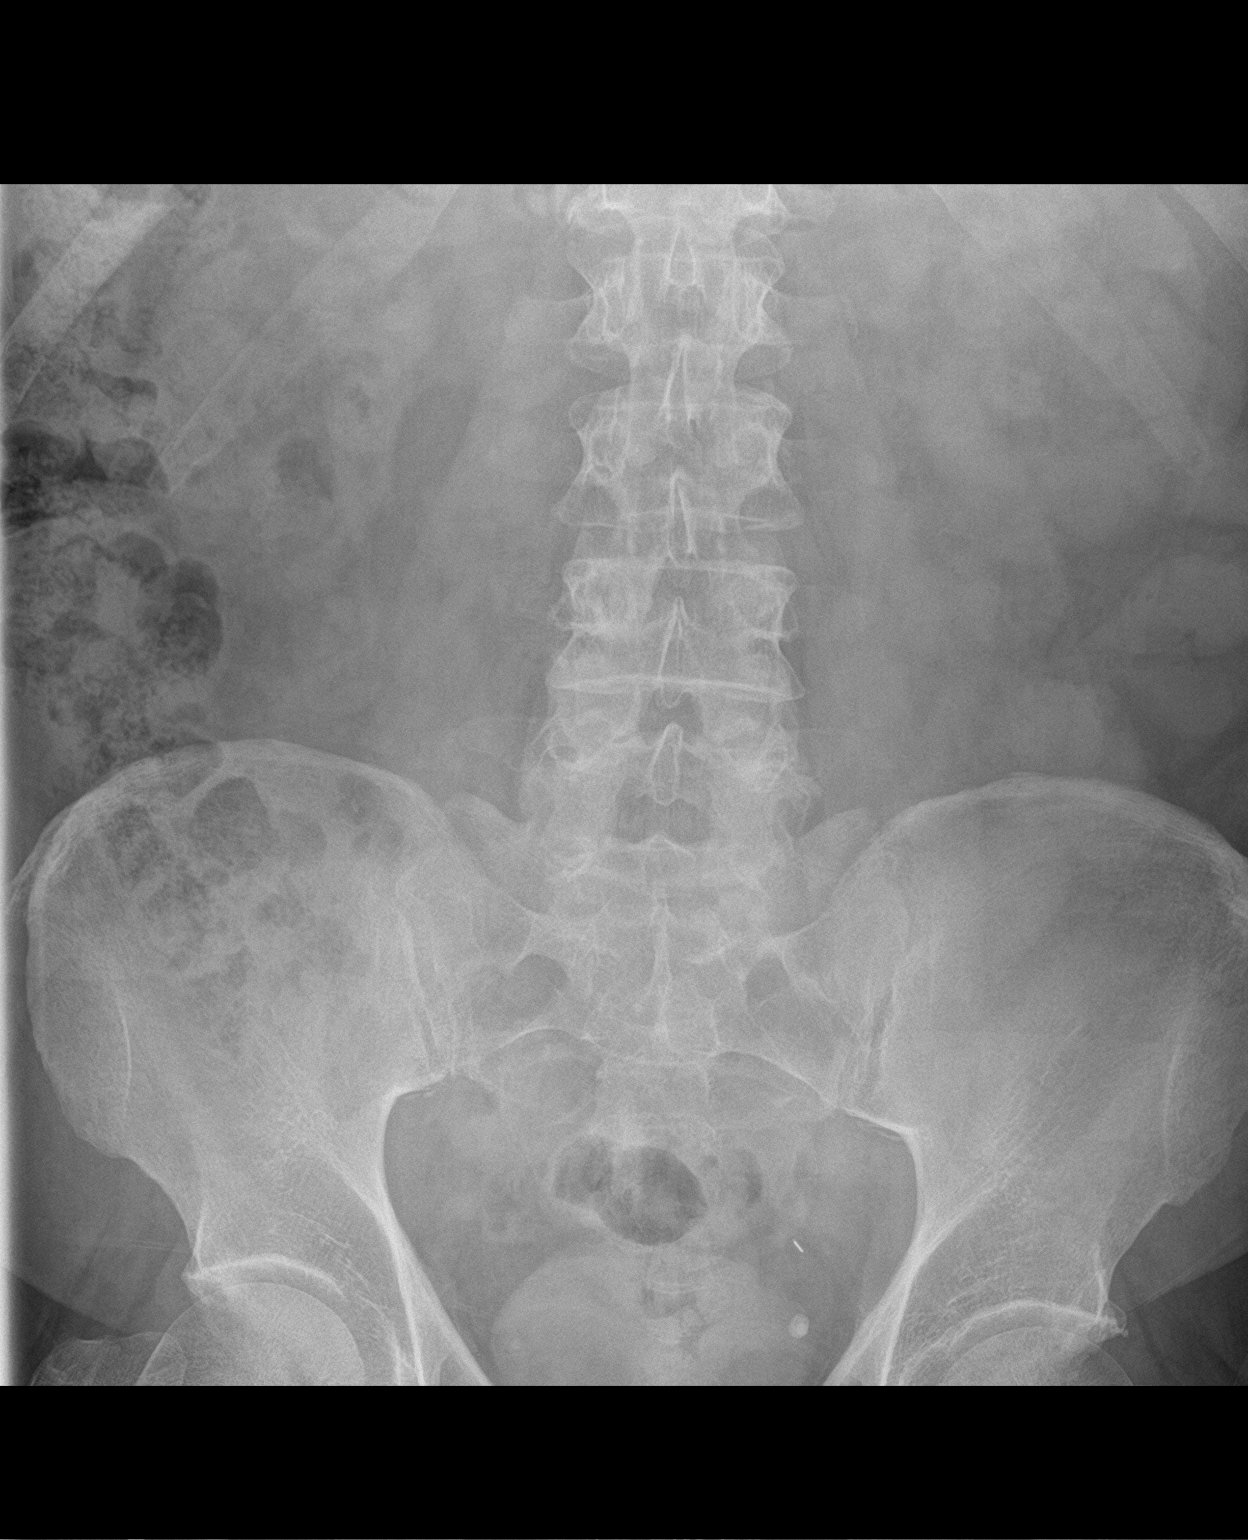

[1 of 1 positions shown; findings below may reference images not displayed]

FINDINGS: left upper pole calculus seen on prior PET-CT is not conspicuous.The
bowel gas pattern is normal. Bilateral pelvic phleboliths. Metallic
seeds in the region of the prostate, and left pelvis. Regional bones
unremarkable.
IMPRESSION: Normal bowel gas pattern

## 2023-12-25 ENCOUNTER — Telehealth: Payer: Self-pay

## 2023-12-25 NOTE — Telephone Encounter (Signed)
 Pt stated Pain in back around the right kidney for the past week and a half pain varies from 1/10-8/10 no discoloration in urine no smell in urine stream is normal no painful or burning w/ urination occasionally drinks a couple of beers and pains seems to go away but knows the beer is not causing the pain to go away. Pt advised that he has a KUB ordered in and to go to [facility] to have that completed

## 2023-12-26 ENCOUNTER — Ambulatory Visit (HOSPITAL_COMMUNITY)
Admission: RE | Admit: 2023-12-26 | Discharge: 2023-12-26 | Disposition: A | Discharge status: Home / Self Care | Source: Ambulatory Visit | Attending: Urology | Admitting: Urology

## 2023-12-26 DIAGNOSIS — N200 Calculus of kidney: Secondary | ICD-10-CM | POA: Insufficient documentation

## 2024-01-07 NOTE — Telephone Encounter (Signed)
 Pt given results per MD McKenzie pt voiced his understanding and confirmed still taking flomax 

## 2024-01-20 ENCOUNTER — Encounter: Payer: Self-pay | Admitting: Radiology

## 2024-03-13 ENCOUNTER — Other Ambulatory Visit: Payer: Self-pay | Admitting: Internal Medicine

## 2024-03-31 ENCOUNTER — Telehealth (HOSPITAL_COMMUNITY): Payer: Self-pay

## 2024-03-31 NOTE — Telephone Encounter (Signed)
 Left voicemail regarding patient's stress test tomorrow. Included all instructions. Advised to call back with any questions.

## 2024-04-01 ENCOUNTER — Other Ambulatory Visit (HOSPITAL_COMMUNITY)

## 2024-04-01 ENCOUNTER — Encounter (HOSPITAL_COMMUNITY): Payer: Self-pay

## 2024-04-01 ENCOUNTER — Encounter (HOSPITAL_COMMUNITY)
Admission: RE | Admit: 2024-04-01 | Discharge: 2024-04-01 | Disposition: A | Discharge status: Home / Self Care | Source: Ambulatory Visit | Attending: Internal Medicine | Admitting: Internal Medicine

## 2024-04-01 ENCOUNTER — Ambulatory Visit (HOSPITAL_COMMUNITY)
Admission: RE | Admit: 2024-04-01 | Discharge: 2024-04-01 | Disposition: A | Discharge status: Home / Self Care | Source: Ambulatory Visit | Attending: Family Medicine | Admitting: Family Medicine

## 2024-04-01 ENCOUNTER — Telehealth: Payer: Self-pay | Admitting: Student

## 2024-04-01 DIAGNOSIS — R9439 Abnormal result of other cardiovascular function study: Secondary | ICD-10-CM

## 2024-04-01 DIAGNOSIS — I5189 Other ill-defined heart diseases: Secondary | ICD-10-CM | POA: Diagnosis not present

## 2024-04-01 DIAGNOSIS — Z0289 Encounter for other administrative examinations: Secondary | ICD-10-CM | POA: Insufficient documentation

## 2024-04-01 LAB — NM MYOCAR MULTI W/SPECT W/WALL MOTION / EF
Angina Index: 0
Estimated workload: 7
Exercise duration (min): 4 min
Exercise duration (sec): 59 s
LV dias vol: 107 mL (ref 62–150)
LV sys vol: 55 mL
MPHR: 152 {beats}/min
Nuc Stress EF: 48 %
Peak HR: 130 {beats}/min
Percent HR: 85 %
RATE: 0.4
RPE: 14
Rest HR: 68 {beats}/min
Rest Nuclear Isotope Dose: 11 mCi
SDS: 4
SRS: 17
SSS: 21
Stress Nuclear Isotope Dose: 30 mCi
TID: 1.19

## 2024-04-01 MED ORDER — TECHNETIUM TC 99M TETROFOSMIN IV KIT
30.0000 | PACK | Freq: Once | INTRAVENOUS | Status: AC | PRN
  Administered 2024-04-01: 30 via INTRAVENOUS

## 2024-04-01 MED ORDER — SODIUM CHLORIDE FLUSH 0.9 % IV SOLN
INTRAVENOUS | Status: AC
  Administered 2024-04-01: 10 mL via INTRAVENOUS
  Filled 2024-04-01: qty 10

## 2024-04-01 MED ORDER — REGADENOSON 0.4 MG/5ML IV SOLN
INTRAVENOUS | Status: AC
  Filled 2024-04-01: qty 5

## 2024-04-01 MED ORDER — TECHNETIUM TC 99M TETROFOSMIN IV KIT
10.0000 | PACK | Freq: Once | INTRAVENOUS | Status: AC | PRN
  Administered 2024-04-01: 11 via INTRAVENOUS

## 2024-04-01 NOTE — Telephone Encounter (Signed)
" °  ° °  Luis Griffin presented for an Exercise nuclear stress test today.  I Laymon CHRISTELLA Qua, PA-C, provided direct supervision and was present during the stress portion of the study today, which was completed without significant symptoms, immediate complications, or acute ST/T changes on ECG.  Stress imaging is pending at this time.  Preliminary ECG findings may be listed in the chart, but the stress test result will not be finalized until perfusion imaging is complete.  Laymon CHRISTELLA Qua, PA-C  04/01/2024, 9:40 AM    "

## 2024-04-06 ENCOUNTER — Ambulatory Visit: Payer: Self-pay | Admitting: Internal Medicine

## 2024-04-09 ENCOUNTER — Other Ambulatory Visit: Payer: Self-pay | Admitting: Internal Medicine

## 2024-04-10 ENCOUNTER — Encounter: Payer: Self-pay | Admitting: Student

## 2024-04-10 ENCOUNTER — Ambulatory Visit: Attending: Student | Admitting: Student

## 2024-04-10 VITALS — BP 110/66 | HR 71 | Ht 71.0 in | Wt 213.0 lb

## 2024-04-10 DIAGNOSIS — E785 Hyperlipidemia, unspecified: Secondary | ICD-10-CM

## 2024-04-10 DIAGNOSIS — I10 Essential (primary) hypertension: Secondary | ICD-10-CM

## 2024-04-10 DIAGNOSIS — I2510 Atherosclerotic heart disease of native coronary artery without angina pectoris: Secondary | ICD-10-CM | POA: Diagnosis not present

## 2024-04-10 DIAGNOSIS — D751 Secondary polycythemia: Secondary | ICD-10-CM | POA: Diagnosis not present

## 2024-04-10 MED ORDER — CLOPIDOGREL BISULFATE 75 MG PO TABS: 75.0000 mg | ORAL_TABLET | Freq: Every day | ORAL | 3 refills | Status: AC

## 2024-04-10 NOTE — Patient Instructions (Signed)
 Medication Instructions:  Your physician recommends that you continue on your current medications as directed. Please refer to the Current Medication list given to you today.  *If you need a refill on your cardiac medications before your next appointment, please call your pharmacy*  Lab Work: NONE   If you have labs (blood work) drawn today and your tests are completely normal, you will receive your results only by: MyChart Message (if you have MyChart) OR A paper copy in the mail If you have any lab test that is abnormal or we need to change your treatment, we will call you to review the results.  Testing/Procedures: NONE   Follow-Up: At [facility], you and your health needs are our priority.  As part of our continuing mission to provide you with exceptional heart care, our providers are all part of one team.  This team includes your primary Cardiologist (physician) and Advanced Practice Providers or APPs (Physician Assistants and Nurse Practitioners) who all work together to provide you with the care you need, when you need it.  Your next appointment:   1 year(s)  Provider:   You may see Vishnu P Mallipeddi, MD or one of the following Advanced Practice Providers on your designated Care Team:   [place], PA-C  [place], [place] Theotis Flake, [place]     We recommend signing up for the patient portal called "MyChart".  Sign up information is provided on this After Visit Summary.  MyChart is used to connect with patients for Virtual Visits (Telemedicine).  Patients are able to view lab/test results, encounter notes, upcoming appointments, etc.  Non-urgent messages can be sent to your provider as well.   To learn more about what you can do with MyChart, go to [email or web address].   Other Instructions Thank you for choosing [facility]!

## 2024-04-10 NOTE — Progress Notes (Signed)
 "  Cardiology Office Note    Date:  04/10/2024  ID:  Luis Griffin, Luis Griffin Sep 29, 1955, MRN [id number] Cardiologist: Diannah SHAUNNA Maywood, MD { :  History of Present Illness:    Luis Griffin is a 69 y.o. male with past medical history of CAD (s/p CABGx4 in 2005 at [facility], PCI in 2006 with details unavailable), adenocarcinoma of the right lower lobe (prior right lower lobe lobectomy in 11/2020) secondary polycythemia, HTN and HLD who presents to the office today for follow-up of his recent abnormal stress test.  He was last examined by Dr. Maywood in 08/2023 and denied any recent anginal symptoms at that time. No recent palpitations or dizziness. He was continued on ASA and statin therapy and was also on Plavix  but this was for his polycythemia and not for a cardiac etiology. He had been intolerant to Lopressor  and Imdur  due to headaches. 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  His Exercise Myoview  was performed on 04/01/2024 and showed a medium fixed perfusion defect in the mid to basal inferior wall which was consistent with infarction and a large, partially reversible perfusion defect along the apical to basal anterolateral and inferoateral wall which appeared consistent with infarction with moderate peri-infarct ischemia. The study was overall read as high risk and Dr. Mallipeddi recommended to arrange an office visit to review symptoms. Of note, he did have a prior NST in 01/2023 which showed evidence of inferior wall infarction as well and partially reversible defect along the entire inferolateral location and anterolateral wall.  In talking with the patient today, he reports overall doing well since his last office visit. He has some shortness of breath with activity but says this has been occurring since prior lobectomy in 2022 and symptoms have actually improved over the past year.  He denies any recent chest pain, palpitations, orthopnea, PND or  pitting edema. He works as a naval architect but says that he is physically active at his home. He was carrying 5-gallon containers of gasoline earlier today without any anginal symptoms. Per his report, he was previously informed by Hematology to continue on ASA 325 mg daily and Plavix  75 mg daily given his polycythemia.  Studies Reviewed:   EKG: EKG is not ordered today.   Echocardiogram: 01/2023 IMPRESSIONS     1. Left ventricular ejection fraction, by estimation, is 50 to 55%. The  left ventricle has low normal function. Left ventricular endocardial  border not optimally defined to evaluate regional wall motion. There is  mild left ventricular hypertrophy. Left  ventricular diastolic parameters are indeterminate.   2. Right ventricular systolic function is normal. The right ventricular  size is normal. Tricuspid regurgitation signal is inadequate for assessing  PA pressure.   3. The mitral valve is normal in structure. No evidence of mitral valve  regurgitation. No evidence of mitral stenosis.   4. The aortic valve was not well visualized. Aortic valve regurgitation  is not visualized. No aortic stenosis is present.   5. The inferior vena cava is normal in size with greater than 50%  respiratory variability, suggesting right atrial pressure of 3 mmHg.   Comparison(s): No prior study.   NST: 03/2024   Patient exercised for 4 minutes and 59 seconds, per Bruce protocol, achieving 7.00 METS. Hypertensive BP response and below average exercise capacity. No chest pain during the test. Test was stopped due to dyspnea.   Baseline EKG showed ST-T abnormalities in inferior  and anterolateral leads that were exaggerated during stress and recovery. Stress ECG is partially uninterpretable due to artifact. Occasional PVCs are noted.   LV perfusion is abnormal. There is evidence of ischemia. There is evidence of infarction. There is a medium fixed perfusion defect with absent uptake present in the mid  to basal inferior wall with abnormal wall motion in the defect area consistent with infarction. There is a large partially reversible perfusion defect with severe reduction in uptake present in the apical to basal anterolateral and inferolateral wall with abnormal wall motion in the defect area consistent with infarction and moderate peri-infarct ischemia.   Left ventricular function is abnormal. Global function is mildly reduced. Nuclear stress EF: 48%   Findings are consistent with infarction with moderate peri-infarct ischemia. The study is high risk.  Physical Exam:   VS:  BP 110/66 (BP Location: Left Arm, Cuff Size: Normal)   Pulse 71   Ht 5' 11 (1.803 m)   Wt 213 lb (96.6 kg)   SpO2 93%   BMI 29.71 kg/m    Wt Readings from Last 3 Encounters:  04/10/24 213 lb (96.6 kg)  08/21/23 212 lb 3.2 oz (96.3 kg)  02/08/23 214 lb (97.1 kg)     GEN: Pleasant male appearing in no acute distress NECK: No JVD; No carotid bruits CARDIAC: RRR, no murmurs, rubs, gallops RESPIRATORY:  Clear to auscultation without rales, wheezing or rhonchi  ABDOMEN: Appears non-distended. No obvious abdominal masses. EXTREMITIES: No clubbing or cyanosis. No pitting edema.  Distal pedal pulses are 2+ bilaterally.   Assessment and Plan:   1. Coronary artery disease involving native coronary artery of native heart without angina pectoris - He previously underwent CABGx4 in 2005 at [facility] with PCI in 2006 with details unavailable. Recent NST as outlined above was high risk but imaging findings were overall similar to prior NST in 01/2023 and he reports having a history of abnormal NSTs dating back for over a decade when performed by his prior cardiologist.  - He remains active at baseline and denies any recent anginal symptoms. Performs over 4 METS of activity routinely without any symptoms. We reviewed options today including further ischemic evaluation with a cardiac catheterization vs. continued medical management and  he prefers to continue medical management given no recent symptoms which is certainly reasonable. I encouraged him to make us  aware if he develops any symptoms and we reviewed warning signs to monitor for. Will provide a letter for him to take for his CDL license. - Continue current medical therapy with ASA 325 mg daily (on this dose per Hematology), Atenolol  25 mg twice daily, Plavix  75 mg daily (on this per Hematology), Lisinopril  20 mg daily and Crestor  20 mg daily.  2. Essential hypertension - His blood pressure is well-controlled at 110/66 during today's visit. Continue current medical therapy with Atenolol  25mg  BID and Lisinopril  20mg  daily.   3. Hyperlipidemia LDL goal <70 - Followed by his PCP. He remains on Crestor  20 mg daily with goal LDL less than 70.  4. Polycythemia - Followed by Hematology/Oncology. CBC in 02/2024 showed his Hgb was at 17.4 with platelets at 192 K. He has remained on ASA 325mg  daily and Plavix  75mg  daily.   Disposition: He prefers to follow-up on an annual basis unless he develops new symptoms in the interim.    Signed, Laymon CHRISTELLA Qua, PA-C   "

## 2024-04-15 NOTE — Telephone Encounter (Signed)
 Labs on 03/06/24 Outside Normal Range  In accordance with refill protocols, please review and address the following requirements before this medication refill can be authorized:  Labs

## 2024-04-27 ENCOUNTER — Other Ambulatory Visit: Payer: 59

## 2024-05-01 ENCOUNTER — Ambulatory Visit: Payer: 59 | Admitting: Urology
